# Patient Record
Sex: Male | Born: 1970 | Marital: Married | Smoking: Never smoker
Health system: Southern US, Community
[De-identification: ages and names within clinical notes are randomized; demographics above are authoritative.]

## PROBLEM LIST (undated history)

## (undated) DIAGNOSIS — K219 Gastro-esophageal reflux disease without esophagitis: Secondary | ICD-10-CM

## (undated) DIAGNOSIS — Z87442 Personal history of urinary calculi: Secondary | ICD-10-CM

## (undated) DIAGNOSIS — I10 Essential (primary) hypertension: Secondary | ICD-10-CM

## (undated) DIAGNOSIS — E785 Hyperlipidemia, unspecified: Secondary | ICD-10-CM

## (undated) DIAGNOSIS — N200 Calculus of kidney: Secondary | ICD-10-CM

## (undated) HISTORY — PX: WISDOM TOOTH EXTRACTION: SHX21

## (undated) HISTORY — DX: Gastro-esophageal reflux disease without esophagitis: K21.9

## (undated) HISTORY — DX: Hyperlipidemia, unspecified: E78.5

## (undated) HISTORY — PX: CYST REMOVAL PEDIATRIC: SHX6282

## (undated) HISTORY — PX: OTHER SURGICAL HISTORY: SHX169

## (undated) HISTORY — DX: Calculus of kidney: N20.0

---

## 2008-10-08 DIAGNOSIS — D239 Other benign neoplasm of skin, unspecified: Secondary | ICD-10-CM

## 2008-10-08 HISTORY — DX: Other benign neoplasm of skin, unspecified: D23.9

## 2008-12-04 ENCOUNTER — Ambulatory Visit: Payer: Self-pay | Admitting: General Practice

## 2008-12-18 ENCOUNTER — Ambulatory Visit: Payer: Self-pay | Admitting: General Practice

## 2009-05-03 ENCOUNTER — Ambulatory Visit: Payer: Self-pay | Admitting: Family

## 2009-11-17 ENCOUNTER — Inpatient Hospital Stay: Payer: Self-pay | Admitting: Internal Medicine

## 2011-12-19 DIAGNOSIS — N200 Calculus of kidney: Secondary | ICD-10-CM

## 2011-12-19 HISTORY — DX: Calculus of kidney: N20.0

## 2012-01-25 ENCOUNTER — Emergency Department: Payer: Self-pay | Admitting: Emergency Medicine

## 2012-01-25 LAB — CBC
HCT: 44.8 % (ref 40.0–52.0)
MCHC: 34.3 g/dL (ref 32.0–36.0)
MCV: 86 fL (ref 80–100)
RDW: 14 % (ref 11.5–14.5)
WBC: 10.2 10*3/uL (ref 3.8–10.6)

## 2012-01-25 LAB — URINALYSIS, COMPLETE
Bilirubin,UR: NEGATIVE
Blood: NEGATIVE
Ketone: NEGATIVE
Leukocyte Esterase: NEGATIVE
Nitrite: NEGATIVE
Ph: 7 (ref 4.5–8.0)
Protein: NEGATIVE
RBC,UR: 4 /HPF (ref 0–5)
Specific Gravity: 1.011 (ref 1.003–1.030)
Squamous Epithelial: NONE SEEN
WBC UR: 1 /HPF (ref 0–5)

## 2012-01-25 LAB — BASIC METABOLIC PANEL
Calcium, Total: 9 mg/dL (ref 8.5–10.1)
Chloride: 109 mmol/L — ABNORMAL HIGH (ref 98–107)
EGFR (Non-African Amer.): >60
Osmolality: 286 (ref 275–301)
Potassium: 4 mmol/L (ref 3.5–5.1)

## 2012-04-05 ENCOUNTER — Other Ambulatory Visit: Payer: Self-pay | Admitting: Urology

## 2012-05-28 ENCOUNTER — Other Ambulatory Visit: Payer: Self-pay | Admitting: Urology

## 2012-05-28 LAB — BASIC METABOLIC PANEL
Anion Gap: 6 — ABNORMAL LOW (ref 7–16)
Calcium, Total: 8.7 mg/dL (ref 8.5–10.1)
Chloride: 106 mmol/L (ref 98–107)
Creatinine: 0.86 mg/dL (ref 0.60–1.30)
EGFR (African American): >60
Glucose: 103 mg/dL — ABNORMAL HIGH (ref 65–99)
Osmolality: 282 (ref 275–301)
Sodium: 141 mmol/L (ref 136–145)

## 2012-05-28 LAB — URIC ACID: Uric Acid: 4.8 mg/dL (ref 3.5–7.2)

## 2012-12-06 ENCOUNTER — Ambulatory Visit (INDEPENDENT_AMBULATORY_CARE_PROVIDER_SITE_OTHER): Payer: PRIVATE HEALTH INSURANCE | Admitting: Internal Medicine

## 2012-12-06 ENCOUNTER — Ambulatory Visit: Payer: Self-pay | Admitting: Internal Medicine

## 2012-12-06 ENCOUNTER — Encounter: Payer: Self-pay | Admitting: Internal Medicine

## 2012-12-06 VITALS — BP 120/80 | HR 84 | Temp 98.0°F | Resp 16 | Ht 70.5 in | Wt 193.0 lb

## 2012-12-06 DIAGNOSIS — E785 Hyperlipidemia, unspecified: Secondary | ICD-10-CM

## 2012-12-06 DIAGNOSIS — Z0000 Encounter for general adult medical examination without abnormal findings: Secondary | ICD-10-CM

## 2012-12-06 MED ORDER — SIMVASTATIN 20 MG PO TABS: 20.0000 mg | ORAL_TABLET | Freq: Every evening | ORAL | Status: DC

## 2012-12-06 NOTE — Assessment & Plan Note (Signed)
General medical exam performed today was normal. Given patient's family history of heart disease, recommended a Mediterranean style diet and regular physical activity with goal of 40 minutes 3 times per week. We'll continue statin medication. Health maintenance is up-to-date. Will obtain vaccination records from [facility].

## 2012-12-06 NOTE — Assessment & Plan Note (Signed)
Pt with family hx of CAD in father. On statin. Per review of notes today, no lipids performed with labs at urologist office. Will order CMP and lipids with fasting labs. Follow up 6 months and prn.

## 2012-12-06 NOTE — Progress Notes (Signed)
  Subjective:    Patient ID: Chris Hernandez, male    DOB: 1971-06-23, 41 y.o.   MRN: [id number]  HPI 41 year old male with history of hyperlipidemia and family history of heart disease presents for followup. He was last seen in her previous office over one year ago. He reports he has been doing well. He did have an episode of nephrolithiasis earlier this year and was seen by a urologist. He denies any recent symptoms of pain, hematuria, fever, chills. He reports he is generally feeling well. He has been following a wholes based food diet and exercising by playing basketball with his son.  Outpatient Encounter Prescriptions as of 12/06/2012  Medication Sig Dispense Refill  . Potassium Citrate (UROCIT-K 15) 15 MEQ (1620 MG) TBCR Take by mouth 2 (two) times daily.      . simvastatin (ZOCOR) 20 MG tablet Take 1 tablet (20 mg total) by mouth every evening.  90 tablet  3  . [DISCONTINUED] simvastatin (ZOCOR) 20 MG tablet Take 20 mg by mouth every evening.       BP 120/80  Pulse 84  Temp 98 F (36.7 C) (Oral)  Resp 16  Ht 5' 10.5" (1.791 m)  Wt 193 lb (87.544 kg)  BMI 27.30 kg/m2  SpO2 96%  Review of Systems  Constitutional: Negative for fever, chills, activity change, appetite change, fatigue and unexpected weight change.  Eyes: Negative for visual disturbance.  Respiratory: Negative for cough and shortness of breath.   Cardiovascular: Negative for chest pain, palpitations and leg swelling.  Gastrointestinal: Negative for abdominal pain and abdominal distention.  Genitourinary: Negative for dysuria, urgency and difficulty urinating.  Musculoskeletal: Negative for arthralgias and gait problem.  Skin: Negative for color change and rash.  Hematological: Negative for adenopathy.  Psychiatric/Behavioral: Negative for sleep disturbance and dysphoric mood. The patient is not nervous/anxious.        Objective:   Physical Exam  Constitutional: He is oriented to person, place, and time. He  appears well-developed and well-nourished. No distress.  HENT:  Head: Normocephalic and atraumatic.  Right Ear: External ear normal.  Left Ear: External ear normal.  Nose: Nose normal.  Mouth/Throat: Oropharynx is clear and moist. No oropharyngeal exudate.  Eyes: Conjunctivae normal and EOM are normal. Pupils are equal, round, and reactive to light. Right eye exhibits no discharge. Left eye exhibits no discharge. No scleral icterus.  Neck: Normal range of motion. Neck supple. No tracheal deviation present. No thyromegaly present.  Cardiovascular: Normal rate, regular rhythm and normal heart sounds.  Exam reveals no gallop and no friction rub.   No murmur heard. Pulmonary/Chest: Effort normal and breath sounds normal. No respiratory distress. He has no wheezes. He has no rales. He exhibits no tenderness.  Abdominal: Soft. Bowel sounds are normal. He exhibits no distension. There is no tenderness.  Musculoskeletal: Normal range of motion. He exhibits no edema.  Lymphadenopathy:    He has no cervical adenopathy.  Neurological: He is alert and oriented to person, place, and time. No cranial nerve deficit. Coordination normal.  Skin: Skin is warm and dry. No rash noted. He is not diaphoretic. No erythema. No pallor.  Psychiatric: He has a normal mood and affect. His behavior is normal. Judgment and thought content normal.          Assessment & Plan:

## 2014-01-07 ENCOUNTER — Other Ambulatory Visit: Payer: Self-pay | Admitting: Internal Medicine

## 2014-02-06 ENCOUNTER — Ambulatory Visit: Payer: PRIVATE HEALTH INSURANCE | Admitting: Internal Medicine

## 2014-02-25 ENCOUNTER — Ambulatory Visit (INDEPENDENT_AMBULATORY_CARE_PROVIDER_SITE_OTHER): Payer: 59 | Admitting: Internal Medicine

## 2014-02-25 ENCOUNTER — Encounter: Payer: Self-pay | Admitting: Internal Medicine

## 2014-02-25 VITALS — BP 118/78 | HR 67 | Temp 98.1°F | Wt 198.0 lb

## 2014-02-25 DIAGNOSIS — E785 Hyperlipidemia, unspecified: Secondary | ICD-10-CM

## 2014-02-25 LAB — LIPID PANEL
CHOLESTEROL: 222 mg/dL — AB (ref 0–200)
HDL: 37.7 mg/dL — ABNORMAL LOW (ref >39.00)
LDL CALC: 134 mg/dL — AB (ref 0–99)
TRIGLYCERIDES: 252 mg/dL — AB (ref 0.0–149.0)
Total CHOL/HDL Ratio: 6
VLDL: 50.4 mg/dL — AB (ref 0.0–40.0)

## 2014-02-25 LAB — COMPREHENSIVE METABOLIC PANEL
ALK PHOS: 51 U/L (ref 39–117)
ALT: 51 U/L (ref 0–53)
AST: 32 U/L (ref 0–37)
Albumin: 4.8 g/dL (ref 3.5–5.2)
BILIRUBIN TOTAL: 0.8 mg/dL (ref 0.3–1.2)
BUN: 14 mg/dL (ref 6–23)
CO2: 27 mEq/L (ref 19–32)
Calcium: 9.5 mg/dL (ref 8.4–10.5)
Chloride: 105 mEq/L (ref 96–112)
Creatinine, Ser: 1 mg/dL (ref 0.4–1.5)
GFR: 84.48 mL/min (ref >60.00)
GLUCOSE: 94 mg/dL (ref 70–99)
Potassium: 4.7 mEq/L (ref 3.5–5.1)
SODIUM: 139 meq/L (ref 135–145)
TOTAL PROTEIN: 7.8 g/dL (ref 6.0–8.3)

## 2014-02-25 LAB — CBC WITH DIFFERENTIAL/PLATELET
BASOS ABS: 0 10*3/uL (ref 0.0–0.1)
Basophils Relative: 0.6 % (ref 0.0–3.0)
EOS ABS: 0.2 10*3/uL (ref 0.0–0.7)
Eosinophils Relative: 2.3 % (ref 0.0–5.0)
HCT: 48 % (ref 39.0–52.0)
Hemoglobin: 16.4 g/dL (ref 13.0–17.0)
LYMPHS PCT: 31.9 % (ref 12.0–46.0)
Lymphs Abs: 2.4 10*3/uL (ref 0.7–4.0)
MCHC: 34.2 g/dL (ref 30.0–36.0)
MCV: 86.9 fl (ref 78.0–100.0)
Monocytes Absolute: 0.7 10*3/uL (ref 0.1–1.0)
Monocytes Relative: 9.6 % (ref 3.0–12.0)
NEUTROS PCT: 55.6 % (ref 43.0–77.0)
Neutro Abs: 4.2 10*3/uL (ref 1.4–7.7)
Platelets: 193 10*3/uL (ref 150.0–400.0)
RBC: 5.53 Mil/uL (ref 4.22–5.81)
RDW: 13.8 % (ref 11.5–14.6)
WBC: 7.5 10*3/uL (ref 4.5–10.5)

## 2014-02-25 MED ORDER — SIMVASTATIN 20 MG PO TABS: 20.0000 mg | ORAL_TABLET | Freq: Every day | ORAL | Status: DC

## 2014-02-25 NOTE — Progress Notes (Signed)
Pre visit review using our clinic review tool, if applicable. No additional management support is needed unless otherwise documented below in the visit note. 

## 2014-02-25 NOTE — Progress Notes (Signed)
   Subjective:    Patient ID: Chris Hernandez, male    DOB: 17-Sep-1971, 43 y.o.   MRN: [id number]  HPI 43YO male with hyperlipidemia presents for follow up. He has been feeling well. No concerns today. Tries to follow a healthy diet and exercise regularly.  Outpatient Prescriptions Prior to Visit  Medication Sig Dispense Refill  . simvastatin (ZOCOR) 20 MG tablet Take 1 tablet by mouth every evening.  30 tablet  0     Review of Systems  Constitutional: Negative for fever, chills, activity change, appetite change, fatigue and unexpected weight change.  Eyes: Negative for visual disturbance.  Respiratory: Negative for cough and shortness of breath.   Cardiovascular: Negative for chest pain, palpitations and leg swelling.  Gastrointestinal: Negative for abdominal pain and abdominal distention.  Genitourinary: Negative for dysuria, urgency and difficulty urinating.  Musculoskeletal: Negative for arthralgias and gait problem.  Skin: Negative for color change and rash.  Hematological: Negative for adenopathy.  Psychiatric/Behavioral: Negative for sleep disturbance and dysphoric mood. The patient is not nervous/anxious.        Objective:    BP 118/78  Pulse 67  Temp(Src) 98.1 F (36.7 C) (Oral)  Wt 198 lb (89.812 kg)  SpO2 97% Physical Exam  Constitutional: He is oriented to person, place, and time. He appears well-developed and well-nourished. No distress.  HENT:  Head: Normocephalic and atraumatic.  Right Ear: External ear normal.  Left Ear: External ear normal.  Nose: Nose normal.  Mouth/Throat: Oropharynx is clear and moist. No oropharyngeal exudate.  Eyes: Conjunctivae and EOM are normal. Pupils are equal, round, and reactive to light. Right eye exhibits no discharge. Left eye exhibits no discharge. No scleral icterus.  Neck: Normal range of motion. Neck supple. No tracheal deviation present. No thyromegaly present.  Cardiovascular: Normal rate, regular rhythm and  normal heart sounds.  Exam reveals no gallop and no friction rub.   No murmur heard. Pulmonary/Chest: Effort normal and breath sounds normal. No accessory muscle usage. Not tachypneic. No respiratory distress. He has no decreased breath sounds. He has no wheezes. He has no rhonchi. He has no rales. He exhibits no tenderness.  Musculoskeletal: Normal range of motion. He exhibits no edema.  Lymphadenopathy:    He has no cervical adenopathy.  Neurological: He is alert and oriented to person, place, and time. No cranial nerve deficit. Coordination normal.  Skin: Skin is warm and dry. No rash noted. He is not diaphoretic. No erythema. No pallor.  Psychiatric: He has a normal mood and affect. His behavior is normal. Judgment and thought content normal.          Assessment & Plan:   Problem List Items Addressed This Visit   Hyperlipidemia LDL goal < 100 - Primary     Will check lipids and LFTs with labs. Encouraged healthy, Mediterranean style diet and regular exercise. Follow up 6 months and prn.    Relevant Medications      simvastatin (ZOCOR) tablet   Other Relevant Orders      Comprehensive metabolic panel (Completed)      Lipid panel (Completed)      CBC with Differential (Completed)       Return for Physical.

## 2014-02-26 NOTE — Assessment & Plan Note (Signed)
Will check lipids and LFTs with labs. Encouraged healthy, Mediterranean style diet and regular exercise. Follow up 6 months and prn.

## 2014-03-04 ENCOUNTER — Encounter: Payer: Self-pay | Admitting: *Deleted

## 2014-03-04 ENCOUNTER — Telehealth: Payer: Self-pay | Admitting: *Deleted

## 2014-03-04 MED ORDER — ATORVASTATIN CALCIUM 20 MG PO TABS: 20.0000 mg | ORAL_TABLET | Freq: Every day | ORAL | Status: DC

## 2014-03-04 NOTE — Telephone Encounter (Signed)
Prescription for Atorvastatin sent pharmacy per lab results.

## 2014-03-04 NOTE — Telephone Encounter (Signed)
Message copied by Ronaldo Miyamoto on Wed Mar 04, 2014  1:36 PM ------      Message from: Ronette Deter A      Created: Mon Mar 02, 2014 11:54 AM       Needs recheck CMP and lipids in 4 weeks. ------

## 2014-03-18 ENCOUNTER — Encounter: Payer: Self-pay | Admitting: Internal Medicine

## 2014-03-26 ENCOUNTER — Encounter: Payer: 59 | Admitting: Internal Medicine

## 2014-04-22 ENCOUNTER — Encounter: Payer: Self-pay | Admitting: Internal Medicine

## 2014-04-22 ENCOUNTER — Ambulatory Visit (INDEPENDENT_AMBULATORY_CARE_PROVIDER_SITE_OTHER): Payer: 59 | Admitting: Internal Medicine

## 2014-04-22 VITALS — BP 112/78 | HR 70 | Temp 98.5°F | Ht 70.9 in | Wt 199.0 lb

## 2014-04-22 DIAGNOSIS — E785 Hyperlipidemia, unspecified: Secondary | ICD-10-CM

## 2014-04-22 DIAGNOSIS — Z1211 Encounter for screening for malignant neoplasm of colon: Secondary | ICD-10-CM | POA: Insufficient documentation

## 2014-04-22 DIAGNOSIS — Z0000 Encounter for general adult medical examination without abnormal findings: Secondary | ICD-10-CM | POA: Insufficient documentation

## 2014-04-22 LAB — COMPREHENSIVE METABOLIC PANEL
ALBUMIN: 4.3 g/dL (ref 3.5–5.2)
ALT: 24 U/L (ref 0–53)
AST: 22 U/L (ref 0–37)
Alkaline Phosphatase: 46 U/L (ref 39–117)
BUN: 14 mg/dL (ref 6–23)
CALCIUM: 9.4 mg/dL (ref 8.4–10.5)
CHLORIDE: 108 meq/L (ref 96–112)
CO2: 25 mEq/L (ref 19–32)
Creatinine, Ser: 1.1 mg/dL (ref 0.4–1.5)
GFR: 79.04 mL/min (ref >60.00)
Glucose, Bld: 85 mg/dL (ref 70–99)
POTASSIUM: 4.1 meq/L (ref 3.5–5.1)
Sodium: 140 mEq/L (ref 135–145)
Total Bilirubin: 0.6 mg/dL (ref 0.2–1.2)
Total Protein: 7 g/dL (ref 6.0–8.3)

## 2014-04-22 LAB — LIPID PANEL
CHOL/HDL RATIO: 5
Cholesterol: 153 mg/dL (ref 0–200)
HDL: 33.5 mg/dL — AB (ref >39.00)
LDL Cholesterol: 89 mg/dL (ref 0–99)
Triglycerides: 152 mg/dL — ABNORMAL HIGH (ref 0.0–149.0)
VLDL: 30.4 mg/dL (ref 0.0–40.0)

## 2014-04-22 NOTE — Progress Notes (Signed)
   Subjective:    Patient ID: Chris Hernandez, male    DOB: [birth year], 43 y.o.   MRN: [id number]  HPI 43YO male presents for annual exam. Doing well. No concerns today. Following healthy diet. Exercising 6 days per week. Compliant with medication.    Review of Systems  Constitutional: Negative for fever, chills, activity change, appetite change, fatigue and unexpected weight change.  Eyes: Negative for visual disturbance.  Respiratory: Negative for cough and shortness of breath.   Cardiovascular: Negative for chest pain, palpitations and leg swelling.  Gastrointestinal: Negative for abdominal pain and abdominal distention.  Genitourinary: Negative for dysuria, urgency and difficulty urinating.  Musculoskeletal: Negative for arthralgias and gait problem.  Skin: Negative for color change and rash.  Hematological: Negative for adenopathy.  Psychiatric/Behavioral: Negative for sleep disturbance and dysphoric mood. The patient is not nervous/anxious.        Objective:    BP 112/78  Pulse 70  Temp(Src) 98.5 F (36.9 C) (Oral)  Ht 5' 10.9" (1.801 m)  Wt 199 lb (90.266 kg)  BMI 27.83 kg/m2  SpO2 97% Physical Exam  Constitutional: He is oriented to person, place, and time. He appears well-developed and well-nourished. No distress.  HENT:  Head: Normocephalic and atraumatic.  Right Ear: External ear normal.  Left Ear: External ear normal.  Nose: Nose normal.  Mouth/Throat: Oropharynx is clear and moist. No oropharyngeal exudate.  Eyes: Conjunctivae and EOM are normal. Pupils are equal, round, and reactive to light. Right eye exhibits no discharge. Left eye exhibits no discharge. No scleral icterus.  Neck: Normal range of motion. Neck supple. No tracheal deviation present. No thyromegaly present.  Cardiovascular: Normal rate, regular rhythm and normal heart sounds.  Exam reveals no gallop and no friction rub.   No murmur heard. Pulmonary/Chest: Effort normal and breath sounds  normal. No respiratory distress. He has no wheezes. He has no rales. He exhibits no tenderness.  Abdominal: Soft. Bowel sounds are normal. He exhibits no distension and no mass. There is no tenderness. There is no rebound and no guarding.  Musculoskeletal: Normal range of motion. He exhibits no edema.  Lymphadenopathy:    He has no cervical adenopathy.  Neurological: He is alert and oriented to person, place, and time. No cranial nerve deficit. Coordination normal.  Skin: Skin is warm and dry. No rash noted. He is not diaphoretic. No erythema. No pallor.  Psychiatric: He has a normal mood and affect. His behavior is normal. Judgment and thought content normal.          Assessment & Plan:   Problem List Items Addressed This Visit   Hyperlipidemia LDL goal < 100   Relevant Orders      Comprehensive metabolic panel      Lipid panel   Routine general medical examination at a health care facility - Primary     General medical exam normal. Discussed recommendations for Mediterranean style diet, exercise 59min 3x per week. Immunizations are UTD. Will check CMP, lipids with labs today.        Return in about 6 months (around 10/23/2014) for Recheck.

## 2014-04-22 NOTE — Assessment & Plan Note (Signed)
General medical exam normal. Discussed recommendations for Mediterranean style diet, exercise 7min 3x per week. Immunizations are UTD. Will check CMP, lipids with labs today.

## 2014-04-22 NOTE — Progress Notes (Signed)
Pre visit review using our clinic review tool, if applicable. No additional management support is needed unless otherwise documented below in the visit note. 

## 2014-04-23 ENCOUNTER — Encounter: Payer: Self-pay | Admitting: *Deleted

## 2014-07-09 ENCOUNTER — Other Ambulatory Visit: Payer: Self-pay | Admitting: Internal Medicine

## 2014-10-28 ENCOUNTER — Ambulatory Visit (INDEPENDENT_AMBULATORY_CARE_PROVIDER_SITE_OTHER): Payer: 59 | Admitting: Internal Medicine

## 2014-10-28 ENCOUNTER — Encounter: Payer: Self-pay | Admitting: Internal Medicine

## 2014-10-28 ENCOUNTER — Encounter: Payer: Self-pay | Admitting: *Deleted

## 2014-10-28 VITALS — BP 144/94 | HR 77 | Temp 98.2°F | Wt 200.8 lb

## 2014-10-28 DIAGNOSIS — M25561 Pain in right knee: Secondary | ICD-10-CM | POA: Insufficient documentation

## 2014-10-28 DIAGNOSIS — E785 Hyperlipidemia, unspecified: Secondary | ICD-10-CM

## 2014-10-28 LAB — COMPREHENSIVE METABOLIC PANEL
ALT: 48 U/L (ref 0–53)
AST: 33 U/L (ref 0–37)
Albumin: 4 g/dL (ref 3.5–5.2)
Alkaline Phosphatase: 55 U/L (ref 39–117)
BILIRUBIN TOTAL: 0.7 mg/dL (ref 0.2–1.2)
BUN: 16 mg/dL (ref 6–23)
CHLORIDE: 106 meq/L (ref 96–112)
CO2: 27 meq/L (ref 19–32)
Calcium: 9.5 mg/dL (ref 8.4–10.5)
Creatinine, Ser: 1 mg/dL (ref 0.4–1.5)
GFR: 85.19 mL/min (ref >60.00)
Glucose, Bld: 109 mg/dL — ABNORMAL HIGH (ref 70–99)
Potassium: 4.2 mEq/L (ref 3.5–5.1)
SODIUM: 139 meq/L (ref 135–145)
TOTAL PROTEIN: 7.4 g/dL (ref 6.0–8.3)

## 2014-10-28 LAB — LIPID PANEL
Cholesterol: 189 mg/dL (ref 0–200)
HDL: 30.6 mg/dL — ABNORMAL LOW (ref >39.00)
LDL Cholesterol: 121 mg/dL — ABNORMAL HIGH (ref 0–99)
NONHDL: 158.4
Total CHOL/HDL Ratio: 6
Triglycerides: 189 mg/dL — ABNORMAL HIGH (ref 0.0–149.0)
VLDL: 37.8 mg/dL (ref 0.0–40.0)

## 2014-10-28 MED ORDER — ATORVASTATIN CALCIUM 20 MG PO TABS: 20.0000 mg | ORAL_TABLET | Freq: Every day | ORAL | Status: DC

## 2014-10-28 NOTE — Patient Instructions (Signed)
Labs today.  We will set up an evaluation with Dr. Tamala Julian in Sports Medicine.

## 2014-10-28 NOTE — Progress Notes (Signed)
Pre visit review using our clinic review tool, if applicable. No additional management support is needed unless otherwise documented below in the visit note. 

## 2014-10-28 NOTE — Assessment & Plan Note (Signed)
Check lipids with labs today. Continue Atorvastatin.

## 2014-10-28 NOTE — Assessment & Plan Note (Signed)
Recent issues with anterior right knee pain. Limiting exercise. Exam is normal today. Will set up evaluation with Dr. Tamala Julian in Sports Medicine.

## 2014-10-28 NOTE — Progress Notes (Signed)
Subjective:    Patient ID: Chris Hernandez, male    DOB: May 05, 1971, 43 y.o.   MRN: [id number]  HPI 43YO male presents for follow up.  Right knee pain - varicose vein on back of knee bothering him. Vein has been swollen at times. Also had pain across front of knee. No trauma to knee. Knee was painful with playing Cornhole. Not taking anything for this.  Hyperlipidemia - Compliant with meds. No side effects noted.  Review of Systems  Constitutional: Negative for fever, chills, activity change, appetite change, fatigue and unexpected weight change.  Eyes: Negative for visual disturbance.  Respiratory: Negative for cough and shortness of breath.   Cardiovascular: Negative for chest pain, palpitations and leg swelling.  Gastrointestinal: Negative for abdominal pain, diarrhea, constipation and abdominal distention.  Genitourinary: Negative for dysuria, urgency and difficulty urinating.  Musculoskeletal: Positive for myalgias and arthralgias. Negative for gait problem.  Skin: Negative for color change and rash.  Hematological: Negative for adenopathy.  Psychiatric/Behavioral: Negative for sleep disturbance and dysphoric mood. The patient is not nervous/anxious.        Objective:    BP 144/94 mmHg  Pulse 77  Temp(Src) 98.2 F (36.8 C) (Oral)  Wt 200 lb 12 oz (91.06 kg)  SpO2 97% Physical Exam  Constitutional: He is oriented to person, place, and time. He appears well-developed and well-nourished. No distress.  HENT:  Head: Normocephalic and atraumatic.  Right Ear: External ear normal.  Left Ear: External ear normal.  Nose: Nose normal.  Mouth/Throat: Oropharynx is clear and moist. No oropharyngeal exudate.  Eyes: Conjunctivae and EOM are normal. Pupils are equal, round, and reactive to light. Right eye exhibits no discharge. Left eye exhibits no discharge. No scleral icterus.  Neck: Normal range of motion. Neck supple. No tracheal deviation present. No thyromegaly present.    Cardiovascular: Normal rate, regular rhythm and normal heart sounds.  Exam reveals no gallop and no friction rub.   No murmur heard. Pulmonary/Chest: Effort normal and breath sounds normal. No accessory muscle usage. No tachypnea. No respiratory distress. He has no decreased breath sounds. He has no wheezes. He has no rhonchi. He has no rales. He exhibits no tenderness.  Musculoskeletal: Normal range of motion. He exhibits no edema.       Right knee: He exhibits normal range of motion and no swelling. No tenderness found.  Lymphadenopathy:    He has no cervical adenopathy.  Neurological: He is alert and oriented to person, place, and time. No cranial nerve deficit. Coordination normal.  Skin: Skin is warm and dry. No rash noted. He is not diaphoretic. No erythema. No pallor.  Psychiatric: He has a normal mood and affect. His behavior is normal. Judgment and thought content normal.          Assessment & Plan:   Problem List Items Addressed This Visit      Unprioritized   Hyperlipidemia - Primary    Check lipids with labs today. Continue Atorvastatin.    Relevant Medications      atorvastatin (LIPITOR) tablet   Other Relevant Orders      Comprehensive metabolic panel      Lipid panel   Right knee pain    Recent issues with anterior right knee pain. Limiting exercise. Exam is normal today. Will set up evaluation with Dr. Tamala Julian in Sports Medicine.    Relevant Orders      Ambulatory referral to Sports Medicine       Return in  about 6 months (around 04/28/2015) for Physical.

## 2014-11-10 ENCOUNTER — Ambulatory Visit: Payer: 59 | Admitting: Family Medicine

## 2015-02-13 ENCOUNTER — Emergency Department: Payer: Self-pay | Admitting: Internal Medicine

## 2015-05-03 ENCOUNTER — Encounter: Payer: 59 | Admitting: Internal Medicine

## 2015-05-03 DIAGNOSIS — Z0289 Encounter for other administrative examinations: Secondary | ICD-10-CM

## 2016-01-05 ENCOUNTER — Other Ambulatory Visit: Payer: Self-pay | Admitting: Internal Medicine

## 2016-01-06 ENCOUNTER — Encounter: Payer: Self-pay | Admitting: Internal Medicine

## 2016-01-21 ENCOUNTER — Ambulatory Visit (INDEPENDENT_AMBULATORY_CARE_PROVIDER_SITE_OTHER): Payer: 59 | Admitting: Internal Medicine

## 2016-01-21 ENCOUNTER — Encounter: Payer: Self-pay | Admitting: Internal Medicine

## 2016-01-21 VITALS — BP 131/90 | HR 86 | Temp 98.5°F | Ht 70.0 in | Wt 196.0 lb

## 2016-01-21 DIAGNOSIS — Z0000 Encounter for general adult medical examination without abnormal findings: Secondary | ICD-10-CM

## 2016-01-21 LAB — CBC WITH DIFFERENTIAL/PLATELET
BASOS PCT: 0.5 % (ref 0.0–3.0)
Basophils Absolute: 0 10*3/uL (ref 0.0–0.1)
EOS ABS: 0.2 10*3/uL (ref 0.0–0.7)
EOS PCT: 2.7 % (ref 0.0–5.0)
HCT: 47.8 % (ref 39.0–52.0)
HEMOGLOBIN: 16.3 g/dL (ref 13.0–17.0)
Lymphocytes Relative: 36.4 % (ref 12.0–46.0)
Lymphs Abs: 2.5 10*3/uL (ref 0.7–4.0)
MCHC: 34 g/dL (ref 30.0–36.0)
MCV: 85.4 fl (ref 78.0–100.0)
MONO ABS: 0.6 10*3/uL (ref 0.1–1.0)
Monocytes Relative: 8.6 % (ref 3.0–12.0)
NEUTROS ABS: 3.6 10*3/uL (ref 1.4–7.7)
Neutrophils Relative %: 51.8 % (ref 43.0–77.0)
PLATELETS: 188 10*3/uL (ref 150.0–400.0)
RBC: 5.6 Mil/uL (ref 4.22–5.81)
RDW: 13.9 % (ref 11.5–15.5)
WBC: 7 10*3/uL (ref 4.0–10.5)

## 2016-01-21 LAB — LIPID PANEL
CHOL/HDL RATIO: 6
CHOLESTEROL: 176 mg/dL (ref 0–200)
HDL: 31.6 mg/dL — ABNORMAL LOW (ref >39.00)
LDL CALC: 107 mg/dL — AB (ref 0–99)
NonHDL: 144.23
Triglycerides: 187 mg/dL — ABNORMAL HIGH (ref 0.0–149.0)
VLDL: 37.4 mg/dL (ref 0.0–40.0)

## 2016-01-21 LAB — COMPREHENSIVE METABOLIC PANEL
ALBUMIN: 4.6 g/dL (ref 3.5–5.2)
ALT: 19 U/L (ref 0–53)
AST: 18 U/L (ref 0–37)
Alkaline Phosphatase: 52 U/L (ref 39–117)
BUN: 16 mg/dL (ref 6–23)
CHLORIDE: 107 meq/L (ref 96–112)
CO2: 26 meq/L (ref 19–32)
CREATININE: 1 mg/dL (ref 0.40–1.50)
Calcium: 9.8 mg/dL (ref 8.4–10.5)
GFR: 85.69 mL/min (ref >60.00)
Glucose, Bld: 105 mg/dL — ABNORMAL HIGH (ref 70–99)
POTASSIUM: 4.2 meq/L (ref 3.5–5.1)
Sodium: 141 mEq/L (ref 135–145)
Total Bilirubin: 0.8 mg/dL (ref 0.2–1.2)
Total Protein: 7.4 g/dL (ref 6.0–8.3)

## 2016-01-21 LAB — TSH: TSH: 1.82 u[IU]/mL (ref 0.35–4.50)

## 2016-01-21 LAB — VITAMIN D 25 HYDROXY (VIT D DEFICIENCY, FRACTURES): VITD: 18.91 ng/mL — ABNORMAL LOW (ref 30.00–100.00)

## 2016-01-21 MED ORDER — ATORVASTATIN CALCIUM 20 MG PO TABS: 20.0000 mg | ORAL_TABLET | Freq: Every day | ORAL | Status: DC

## 2016-01-21 NOTE — Progress Notes (Signed)
Subjective:    Patient ID: Chris Hernandez, male    DOB: 01/24/1971, 45 y.o.   MRN: [id number]  HPI  45YO male presents for physical exam.  Feeling well. No concerns today. Compliant with medication.  Wt Readings from Last 3 Encounters:  01/21/16 196 lb (88.905 kg)  10/28/14 200 lb 12 oz (91.06 kg)  04/22/14 199 lb (90.266 kg)   BP Readings from Last 3 Encounters:  01/21/16 131/90  10/28/14 144/94  04/22/14 112/78    Past Medical History  Diagnosis Date  . Kidney stones 2013  . Acid reflux   . Hyperlipidemia    Family History  Problem Relation Age of Onset  . Arthritis Mother   . Hypertension Mother   . Heart disease Father 57  . Hypertension Father   . Cancer Maternal Aunt     Breast  . Hypertension Maternal Grandmother   . Arthritis Maternal Grandmother     rheumatoid  . Cancer Maternal Aunt     Breast  . Diabetes Maternal Aunt    Past Surgical History  Procedure Laterality Date  . Jerrye Bushy    . Cyst removal pediatric      Throat   Social History   Social History  . Marital Status: Married    Spouse Name: N/A  . Number of Children: N/A  . Years of Education: N/A   Social History Main Topics  . Smoking status: Never Smoker   . Smokeless tobacco: None  . Alcohol Use: Yes  . Drug Use: No  . Sexual Activity: Not Asked   Other Topics Concern  . None   Social History Narrative   Lives in [place] with wife and 2 kids 8YO and [place].      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  Eyes: Negative for visual disturbance.  Respiratory: Negative for cough and shortness of breath.   Cardiovascular: Negative for chest pain, palpitations and leg swelling.  Gastrointestinal: Negative for nausea, vomiting, abdominal pain, diarrhea, constipation and abdominal distention.  Genitourinary: Negative for dysuria, urgency and difficulty urinating.  Musculoskeletal: Negative for arthralgias and gait problem.  Skin: Negative for color change and rash.  Neurological: Negative for weakness.  Hematological: Negative for adenopathy.  Psychiatric/Behavioral: Negative for suicidal ideas, sleep disturbance and dysphoric mood. The patient is not nervous/anxious.        Objective:    BP 131/90 mmHg  Pulse 86  Temp(Src) 98.5 F (36.9 C) (Oral)  Ht 5\' 10"  (1.778 m)  Wt 196 lb (88.905 kg)  BMI 28.12 kg/m2  SpO2 96% Physical Exam  Constitutional: He is oriented to person, place, and time. He appears well-developed and well-nourished.  No distress.  HENT:  Head: Normocephalic and atraumatic.  Right Ear: External ear normal.  Left Ear: External ear normal.  Nose: Nose normal.  Mouth/Throat: Oropharynx is clear and moist. No oropharyngeal exudate.  Eyes: Conjunctivae and EOM are normal. Pupils are equal, round, and reactive to light. Right eye exhibits no discharge. Left eye exhibits no discharge. No scleral icterus.  Neck: Normal range of motion. Neck supple. No tracheal deviation present. No thyromegaly present.  Cardiovascular: Normal rate, regular rhythm and normal heart sounds.  Exam reveals no gallop and no friction rub.   No murmur heard. Pulmonary/Chest: Effort normal and breath sounds normal. No respiratory distress. He has no wheezes. He has no rales. He exhibits no tenderness.  Abdominal: Soft. Bowel sounds are normal. He exhibits no distension and no mass. There is no tenderness. There is no  rebound and no guarding.  Musculoskeletal: Normal range of motion. He exhibits no edema.  Lymphadenopathy:    He has no cervical adenopathy.  Neurological: He is alert and oriented to person, place, and time. No cranial nerve deficit. Coordination normal.  Skin: Skin is warm and dry. No rash noted. He is not diaphoretic. No erythema. No pallor.  Psychiatric: He has a normal mood and affect. His behavior is normal. Judgment and thought content normal.          Assessment & Plan:   Problem List Items Addressed This Visit      Unprioritized   Routine general medical examination at a health care facility - Primary    General medical exam normal today. Labs as ordered. Immunizations UTD. Encouraged healthy diet and exercise. Follow up in 6 months and prn.      Relevant Orders   CBC with Differential/Platelet   Comprehensive metabolic panel   Lipid panel   TSH   VITAMIN D 25 Hydroxy (Vit-D Deficiency, Fractures)       Return in about 6 months (around 07/20/2016) for Recheck.

## 2016-01-21 NOTE — Patient Instructions (Signed)

## 2016-01-21 NOTE — Progress Notes (Signed)
Pre visit review using our clinic review tool, if applicable. No additional management support is needed unless otherwise documented below in the visit note. 

## 2016-01-21 NOTE — Assessment & Plan Note (Signed)
General medical exam normal today. Labs as ordered. Immunizations UTD. Encouraged healthy diet and exercise. Follow up in 6 months and prn.

## 2016-07-21 ENCOUNTER — Ambulatory Visit: Payer: 59 | Admitting: Internal Medicine

## 2016-07-26 IMAGING — CT CT ABD-PELV W/O CM
1 of 4 series · 5 of 46 positions shown, 10 images · non-contrast
Comparison: Prior abdominal radiographs 04/05/2012

CLINICAL DATA: 43-year-old male with acute onset right-sided flank
pain and known history of kidney stones.

EXAM:
CT ABDOMEN AND PELVIS WITHOUT CONTRAST
TECHNIQUE: Multidetector CT imaging of the abdomen and pelvis was performed
following the standard protocol without IV contrast.

[Series 4: lung windows · axial · 0.78mm/px · z∈[-42,+44]mm · 5 of 27 slices shown, 10 images]
[im 5/27  soft-tissue]
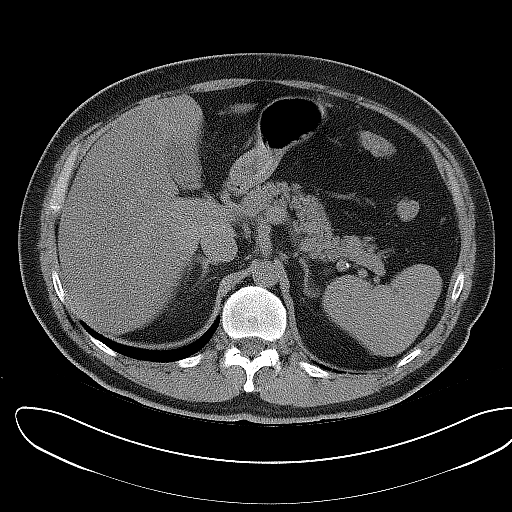
[im 5/27  bone]
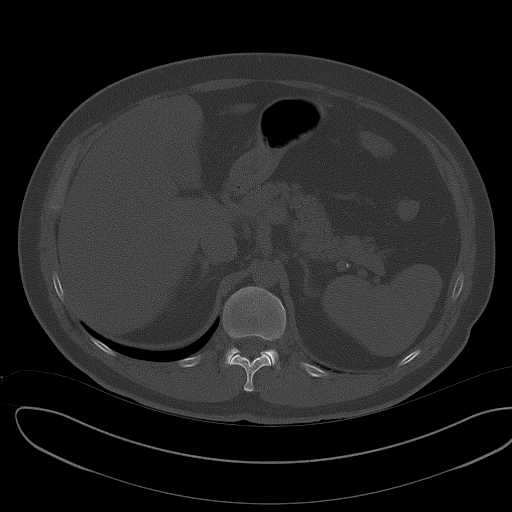
[im 9/27  soft-tissue]
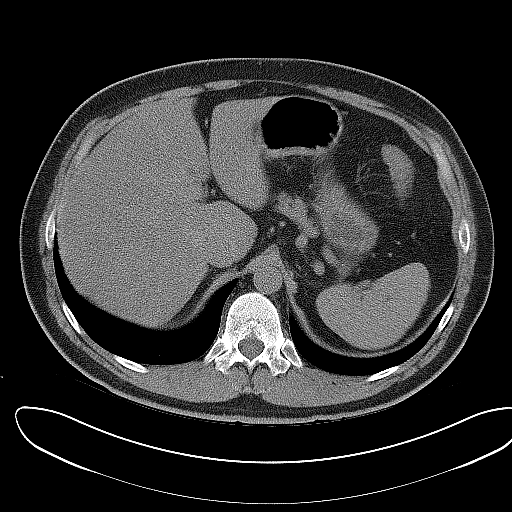
[im 9/27  lung]
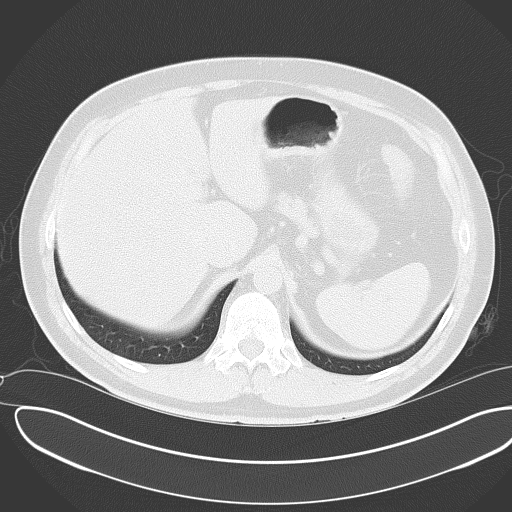
[im 14/27  soft-tissue]
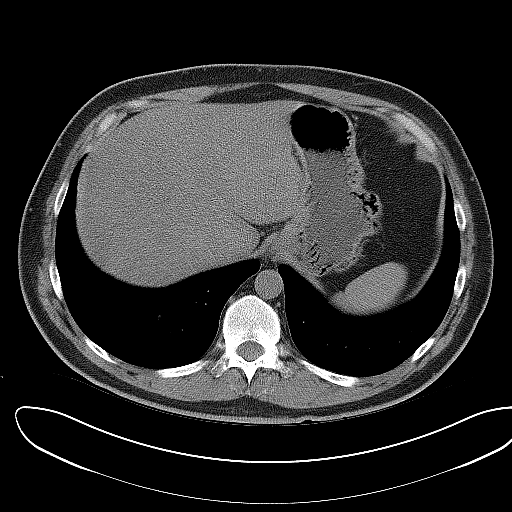
[im 14/27  lung]
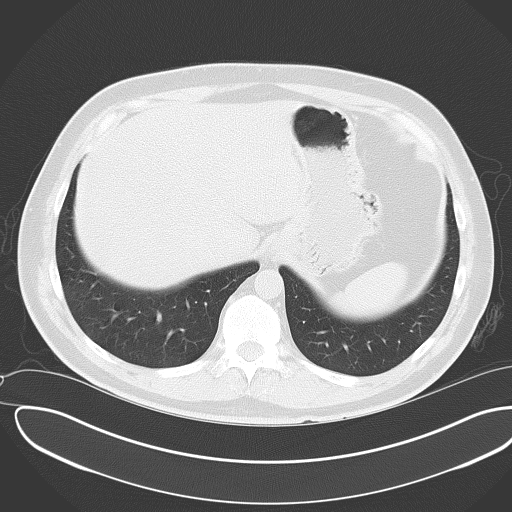
[im 18/27  soft-tissue]
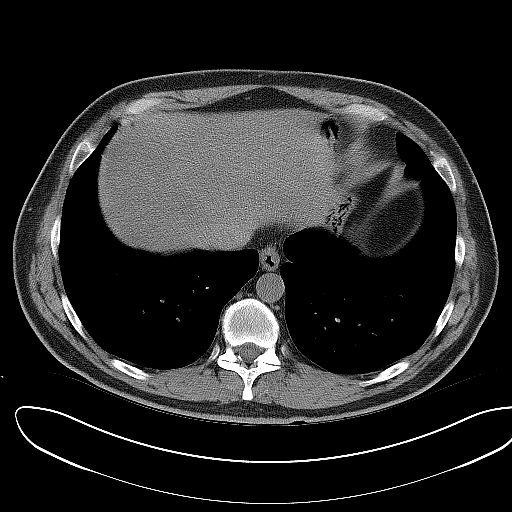
[im 18/27  lung]
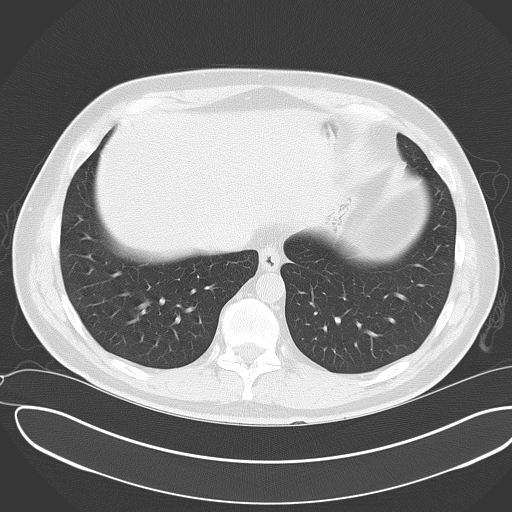
[im 22/27  soft-tissue]
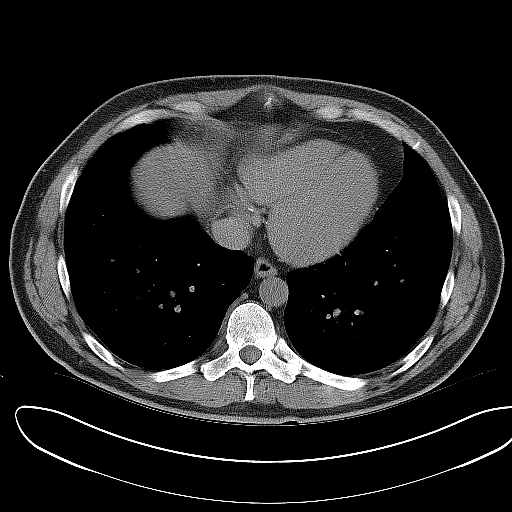
[im 22/27  lung]
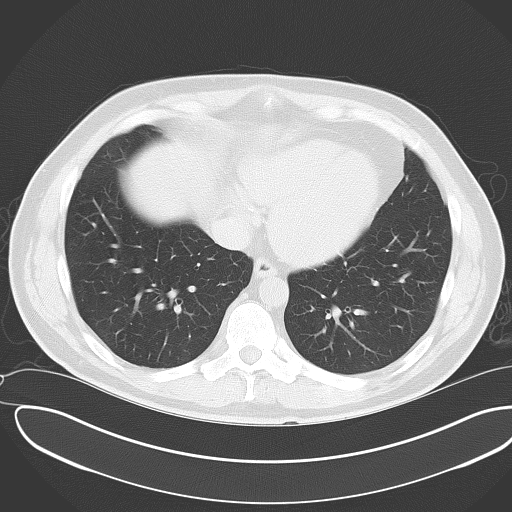

[5 of 46 positions shown; findings below may reference images not displayed]

FINDINGS: Lower Chest: The lung bases are clear. Visualized cardiac structures
are within normal limits for size. No pericardial effusion.
Unremarkable visualized distal thoracic esophagus.

Abdomen: Unenhanced CT was performed per clinician order. Lack of IV
contrast limits sensitivity and specificity, especially for
evaluation of abdominal/pelvic solid viscera. Within these
limitations, unremarkable CT appearance of the stomach, duodenum,
spleen, adrenal glands and pancreas. Normal hepatic contour and
morphology. No discrete hepatic lesion. Low attenuation of the
hepatic parenchyma consistent with steatosis. There is relative
sparing around the gallbladder fossa. Gallbladder is unremarkable.
No intra or extrahepatic biliary ductal dilatation.

All very mild fullness of the right renal collecting system. No
evidence of right-sided nephro or ureterolithiasis. There is mild
dilatation of the right ureter intermittently. No stone noted at the
UVJ or within the bladder. No hydronephrosis on the left. Punctate
stones are present in the interpolar and lower pole collecting
systems. Nonobstructing 3 mm stone present within the UPJ.

No evidence of obstruction or focal bowel wall thickening. Normal
appendix in the right lower quadrant. The terminal ileum is
unremarkable. Solitary descending colonic diverticulum without
evidence of active inflammation. Circumscribed fat attenuation
structures adjacent to the sigmoid colon without surrounding
inflammation likely reflect prominent epiploic appendages. Combined
indirect and direct right inguinal hernia containing herniated
omental fat. No free fluid or suspicious adenopathy.

Pelvis: The bladder is decompressed. Mild prostatomegaly with
central calcification. Central for calcification could be seen on
prior radiography from 5549. No free fluid or suspicious adenopathy.

Bones/Soft Tissues: No acute fracture or aggressive appearing lytic
or blastic osseous lesion. Loss of disc space height at L4-L5 and
L5-S1 consistent with degenerative disc disease.

Vascular: Limited evaluation in the absence of intravenous contrast.
No aneurysmal dilatation. No significant atherosclerotic vascular
calcifications. Incidental note is made of day retro aortic left
renal vein.
IMPRESSION: 1. Although no right-sided renal or ureteral stones are identified,
there is mild prominence of the right renal collecting system and
intermittent mild ureteral dilatation. Given the patient's
right-sided flank pain this may reflect recent passage of a stone.
2. Combined indirect and direct right inguinal hernia containing
omental fat. This could also provide a source for right lower
quadrant pain.
3. Prominent circumscribed fat attenuation structures adjacent to
the sigmoid colon likely represents mildly prominent epiploic
appendages. There is no surrounding inflammatory change to suggest
acute epiploic appendagitis.
4. Punctate nonobstructing nephrolithiasis in the left interpolar
and lower pole collecting systems as well as within the left UPJ.
5. Hepatic steatosis.

## 2017-10-11 ENCOUNTER — Encounter: Payer: Self-pay | Admitting: Family

## 2017-10-11 ENCOUNTER — Ambulatory Visit (INDEPENDENT_AMBULATORY_CARE_PROVIDER_SITE_OTHER): Payer: 59 | Admitting: Family

## 2017-10-11 VITALS — BP 140/100 | HR 73 | Temp 98.0°F | Wt 199.4 lb

## 2017-10-11 DIAGNOSIS — I10 Essential (primary) hypertension: Secondary | ICD-10-CM

## 2017-10-11 MED ORDER — LISINOPRIL 5 MG PO TABS: 5.0000 mg | ORAL_TABLET | Freq: Every day | ORAL | 3 refills | Status: DC

## 2017-10-11 NOTE — Progress Notes (Signed)
Subjective:    Patient ID: Chris Hernandez, male    DOB: [birth year], 46 y.o.   MRN: [id number]  CC: Chris Hernandez is a 46 y.o. male who presents today for follow up.   HPI: Elevated blood pressure at work- no h/o HTN. Starting walking twice per day. Checking  At home- 140/90. Limiting salt.   Denies exertional chest pain or pressure, numbness or tingling radiating to left arm or jaw, palpitations, dizziness, frequent headaches, changes in vision, or shortness of breath.   Non smoker  Reports stress test years which was normal- unable to see records  No h/o ckd        HISTORY:  Past Medical History:  Diagnosis Date  . Acid reflux   . Hyperlipidemia   . Kidney stones 2013   Past Surgical History:  Procedure Laterality Date  . CYST REMOVAL PEDIATRIC     Throat  . gerd     Family History  Problem Relation Age of Onset  . Arthritis Mother   . Hypertension Mother   . Heart disease Father 23  . Hypertension Father   . Cancer Maternal Aunt        Breast  . Hypertension Maternal Grandmother   . Arthritis Maternal Grandmother        rheumatoid  . Cancer Maternal Aunt        Breast  . Diabetes Maternal Aunt     Allergies: Patient has no known allergies. No current outpatient prescriptions on file prior to visit.   No current facility-administered medications on file prior to visit.     Social History  Substance Use Topics  . Smoking status: Never Smoker  . Smokeless tobacco: Not on file  . Alcohol use Yes    Review of Systems  Constitutional: Negative for chills and fever.  Respiratory: Negative for cough.   Cardiovascular: Negative for chest pain and palpitations.  Gastrointestinal: Negative for nausea and vomiting.      Objective:    BP (!) 140/100 (BP Location: Left Arm, Patient Position: Sitting, Cuff Size: Normal)   Pulse 73   Temp 98 F (36.7 C) (Oral)   Wt 199 lb 6 oz (90.4 kg)   SpO2 98%   BMI 28.61 kg/m  BP Readings from  Last 3 Encounters:  10/11/17 (!) 140/100  01/21/16 131/90  10/28/14 (!) 144/94   Wt Readings from Last 3 Encounters:  10/11/17 199 lb 6 oz (90.4 kg)  01/21/16 196 lb (88.9 kg)  10/28/14 200 lb 12 oz (91.1 kg)    Physical Exam  Constitutional: He appears well-developed and well-nourished.  Cardiovascular: Regular rhythm and normal heart sounds.   Pulmonary/Chest: Effort normal and breath sounds normal. No respiratory distress. He has no wheezes. He has no rhonchi. He has no rales.  Neurological: He is alert.  Skin: Skin is warm and dry.  Psychiatric: He has a normal mood and affect. His speech is normal and behavior is normal.  Vitals reviewed.      Assessment & Plan:   Problem List Items Addressed This Visit      Cardiovascular and Mediastinum   HTN (hypertension) - Primary    Elevated today We'll start low-dose ramipril. Pending BMP. Patient will monitor home.      Relevant Medications   lisinopril (PRINIVIL,ZESTRIL) 5 MG tablet   Other Relevant Orders   Basic metabolic panel       I have discontinued Chris Hernandez's atorvastatin. I am also having him start on  lisinopril.   Meds ordered this encounter  Medications  . lisinopril (PRINIVIL,ZESTRIL) 5 MG tablet    Sig: Take 1 tablet (5 mg total) by mouth daily.    Dispense:  90 tablet    Refill:  3    Order Specific Question:   Supervising Provider    Answer:   Chris Hernandez [[id number]]    Return precautions given.   Risks, benefits, and alternatives of the medications and treatment plan prescribed today were discussed, and patient expressed understanding.   Education regarding symptom management and diagnosis given to patient on AVS.  Continue to follow with Chris Hawthorne, FNP for routine health maintenance.   Chris Hernandez and I agreed with plan.   Chris Paris, FNP

## 2017-10-11 NOTE — Patient Instructions (Addendum)
Start low dose lisinopril  Labs on Monday/tuesday  Monitor blood pressure,  Goal is less than 130/80; if persistently higher, please make sooner follow up appointment so we can recheck you blood pressure and manage medications   Managing Your Hypertension Hypertension is commonly called high blood pressure. This is when the force of your blood pressing against the walls of your arteries is too strong. Arteries are blood vessels that carry blood from your heart throughout your body. Hypertension forces the heart to work harder to pump blood, and may cause the arteries to become narrow or stiff. Having untreated or uncontrolled hypertension can cause heart attack, stroke, kidney disease, and other problems. What are blood pressure readings? A blood pressure reading consists of a higher number over a lower number. Ideally, your blood pressure should be below 120/80. The first ("top") number is called the systolic pressure. It is a measure of the pressure in your arteries as your heart beats. The second ("bottom") number is called the diastolic pressure. It is a measure of the pressure in your arteries as the heart relaxes. What does my blood pressure reading mean? Blood pressure is classified into four stages. Based on your blood pressure reading, your health care provider may use the following stages to determine what type of treatment you need, if any. Systolic pressure and diastolic pressure are measured in a unit called mm Hg. Normal  Systolic pressure: below 540.  Diastolic pressure: below 80. Elevated  Systolic pressure: 981-191.  Diastolic pressure: below 80. Hypertension stage 1  Systolic pressure: 478-295.  Diastolic pressure: 62-13. Hypertension stage 2  Systolic pressure: 086 or above.  Diastolic pressure: 90 or above. What health risks are associated with hypertension? Managing your hypertension is an important responsibility. Uncontrolled hypertension can lead to:  A  heart attack.  A stroke.  A weakened blood vessel (aneurysm).  Heart failure.  Kidney damage.  Eye damage.  Metabolic syndrome.  Memory and concentration problems.  What changes can I make to manage my hypertension? Hypertension can be managed by making lifestyle changes and possibly by taking medicines. Your health care provider will help you make a plan to bring your blood pressure within a normal range. Eating and drinking  Eat a diet that is high in fiber and potassium, and low in salt (sodium), added sugar, and fat. An example eating plan is called the DASH (Dietary Approaches to Stop Hypertension) diet. To eat this way: ? Eat plenty of fresh fruits and vegetables. Try to fill half of your plate at each meal with fruits and vegetables. ? Eat whole grains, such as whole wheat pasta, brown rice, or whole grain bread. Fill about one quarter of your plate with whole grains. ? Eat low-fat diary products. ? Avoid fatty cuts of meat, processed or cured meats, and poultry with skin. Fill about one quarter of your plate with lean proteins such as fish, chicken without skin, beans, eggs, and tofu. ? Avoid premade and processed foods. These tend to be higher in sodium, added sugar, and fat.  Reduce your daily sodium intake. Most people with hypertension should eat less than 1,500 mg of sodium a day.  Limit alcohol intake to no more than 1 drink a day for nonpregnant women and 2 drinks a day for men. One drink equals 12 oz of beer, 5 oz of wine, or 1 oz of hard liquor. Lifestyle  Work with your health care provider to maintain a healthy body weight, or to lose weight. Ask  what an ideal weight is for you.  Get at least 30 minutes of exercise that causes your heart to beat faster (aerobic exercise) most days of the week. Activities may include walking, swimming, or biking.  Include exercise to strengthen your muscles (resistance exercise), such as weight lifting, as part of your weekly  exercise routine. Try to do these types of exercises for 30 minutes at least 3 days a week.  Do not use any products that contain nicotine or tobacco, such as cigarettes and e-cigarettes. If you need help quitting, ask your health care provider.  Control any long-term (chronic) conditions you have, such as high cholesterol or diabetes. Monitoring  Monitor your blood pressure at home as told by your health care provider. Your personal target blood pressure may vary depending on your medical conditions, your age, and other factors.  Have your blood pressure checked regularly, as often as told by your health care provider. Working with your health care provider  Review all the medicines you take with your health care provider because there may be side effects or interactions.  Talk with your health care provider about your diet, exercise habits, and other lifestyle factors that may be contributing to hypertension.  Visit your health care provider regularly. Your health care provider can help you create and adjust your plan for managing hypertension. Will I need medicine to control my blood pressure? Your health care provider may prescribe medicine if lifestyle changes are not enough to get your blood pressure under control, and if:  Your systolic blood pressure is 130 or higher.  Your diastolic blood pressure is 80 or higher.  Take medicines only as told by your health care provider. Follow the directions carefully. Blood pressure medicines must be taken as prescribed. The medicine does not work as well when you skip doses. Skipping doses also puts you at risk for problems. Contact a health care provider if:  You think you are having a reaction to medicines you have taken.  You have repeated (recurrent) headaches.  You feel dizzy.  You have swelling in your ankles.  You have trouble with your vision. Get help right away if:  You develop a severe headache or confusion.  You have  unusual weakness or numbness, or you feel faint.  You have severe pain in your chest or abdomen.  You vomit repeatedly.  You have trouble breathing. Summary  Hypertension is when the force of blood pumping through your arteries is too strong. If this condition is not controlled, it may put you at risk for serious complications.  Your personal target blood pressure may vary depending on your medical conditions, your age, and other factors. For most people, a normal blood pressure is less than 120/80.  Hypertension is managed by lifestyle changes, medicines, or both. Lifestyle changes include weight loss, eating a healthy, low-sodium diet, exercising more, and limiting alcohol. This information is not intended to replace advice given to you by your health care provider. Make sure you discuss any questions you have with your health care provider. Document Released: 08/28/2012 Document Revised: 11/01/2016 Document Reviewed: 11/01/2016 Elsevier Interactive Patient Education  [employer].

## 2017-10-11 NOTE — Assessment & Plan Note (Signed)
Elevated today We'll start low-dose ramipril. Pending BMP. Patient will monitor home.

## 2017-10-16 ENCOUNTER — Other Ambulatory Visit (INDEPENDENT_AMBULATORY_CARE_PROVIDER_SITE_OTHER): Payer: 59

## 2017-10-16 DIAGNOSIS — I10 Essential (primary) hypertension: Secondary | ICD-10-CM

## 2017-10-17 LAB — BASIC METABOLIC PANEL
BUN: 13 mg/dL (ref 6–23)
CALCIUM: 9.6 mg/dL (ref 8.4–10.5)
CHLORIDE: 104 meq/L (ref 96–112)
CO2: 29 meq/L (ref 19–32)
Creatinine, Ser: 0.94 mg/dL (ref 0.40–1.50)
GFR: 91.34 mL/min (ref >60.00)
GLUCOSE: 87 mg/dL (ref 70–99)
Potassium: 4.6 mEq/L (ref 3.5–5.1)
Sodium: 141 mEq/L (ref 135–145)

## 2018-04-29 ENCOUNTER — Ambulatory Visit (INDEPENDENT_AMBULATORY_CARE_PROVIDER_SITE_OTHER): Payer: No Typology Code available for payment source | Admitting: Family

## 2018-04-29 ENCOUNTER — Encounter: Payer: Self-pay | Admitting: Family

## 2018-04-29 VITALS — BP 160/98 | HR 63 | Temp 98.9°F | Resp 16 | Wt 198.4 lb

## 2018-04-29 DIAGNOSIS — I10 Essential (primary) hypertension: Secondary | ICD-10-CM

## 2018-04-29 DIAGNOSIS — R1031 Right lower quadrant pain: Secondary | ICD-10-CM | POA: Diagnosis not present

## 2018-04-29 DIAGNOSIS — Z0001 Encounter for general adult medical examination with abnormal findings: Secondary | ICD-10-CM

## 2018-04-29 DIAGNOSIS — K625 Hemorrhage of anus and rectum: Secondary | ICD-10-CM

## 2018-04-29 DIAGNOSIS — Z0000 Encounter for general adult medical examination without abnormal findings: Secondary | ICD-10-CM

## 2018-04-29 NOTE — Progress Notes (Signed)
Subjective:    Patient ID: Chris Hernandez, male    DOB: 1971/09/16, 47 y.o.   MRN: [id number]  CC: Chris Hernandez is a 47 y.o. male who presents today for physical exam.    HPI: Right inguinal hernia ; this was evaluated 4-5 years ago. When protrudes, has pain, which is self limiting. Told to come back if became more bothersome. Would like a surgery consult and feels like when bulges out, it is larger. . No constipation, abdominal pain, N, V.   Occasionally notes BRB on toilet. No straining. No blood in stool. No melena.   HTN- hasnt taken blood pressure medication today, usually takes lisinopril at night. Had wendy's prior to visit. At home 120/ 90. Denies exertional chest pain or pressure, numbness or tingling radiating to left arm or jaw, palpitations, dizziness, frequent headaches, changes in vision, or shortness of breath.       Colorectal  Cancer Screening: no early family history Prostate Cancer Screening: NO urinary hesitancy.  Lung Cancer Screening: No 30 year pack year history and > 55 years. Immunizations       Tetanus - utd         HIV Screening- Candidate for , consents Labs: Screening labs today. Exercise: Gets regular exercise.  Alcohol use: occasoinal Smoking/tobacco use: Nonsmoker.  Regular dental exams:UTD Wears seat belt: Yes. Skin: h/o precancerous lesions removed; hasnt been back  HISTORY:  Past Medical History:  Diagnosis Date  . Acid reflux   . Hyperlipidemia   . Kidney stones 2013    Past Surgical History:  Procedure Laterality Date  . CYST REMOVAL PEDIATRIC     Throat  . gerd     Family History  Problem Relation Age of Onset  . Arthritis Mother   . Hypertension Mother   . Heart disease Father 48  . Hypertension Father   . Cancer Maternal Aunt        Breast  . Hypertension Maternal Grandmother   . Arthritis Maternal Grandmother        rheumatoid  . Cancer Maternal Aunt        Breast  . Diabetes Maternal Aunt        ALLERGIES: Patient has no known allergies.  Current Outpatient Medications on File Prior to Visit  Medication Sig Dispense Refill  . lisinopril (PRINIVIL,ZESTRIL) 5 MG tablet Take 1 tablet (5 mg total) by mouth daily. 90 tablet 3   No current facility-administered medications on file prior to visit.     Social History   Tobacco Use  . Smoking status: Never Smoker  Substance Use Topics  . Alcohol use: Yes    Comment: occasional  . Drug use: No    Review of Systems  Constitutional: Negative for chills and fever.  HENT: Negative for congestion.   Respiratory: Negative for cough.   Cardiovascular: Negative for chest pain, palpitations and leg swelling.  Gastrointestinal: Positive for anal bleeding. Negative for abdominal distention, abdominal pain, blood in stool, diarrhea, nausea and vomiting.  Genitourinary: Negative for difficulty urinating.  Musculoskeletal: Negative for myalgias.  Skin: Negative for rash.  Neurological: Negative for headaches.  Hematological: Negative for adenopathy.  Psychiatric/Behavioral: Negative for confusion.      Objective:    BP (!) 160/98 (BP Location: Left Arm, Patient Position: Sitting, Cuff Size: Normal)   Pulse 63   Temp 98.9 F (37.2 C) (Oral)   Resp 16   Wt 198 lb 6 oz (90 kg)   SpO2 97%  BMI 28.46 kg/m   BP Readings from Last 3 Encounters:  04/29/18 (!) 160/98  10/11/17 (!) 140/100  01/21/16 131/90   Wt Readings from Last 3 Encounters:  04/29/18 198 lb 6 oz (90 kg)  10/11/17 199 lb 6 oz (90.4 kg)  01/21/16 196 lb (88.9 kg)    Physical Exam  Constitutional: He appears well-developed and well-nourished.  Neck: No thyroid mass and no thyromegaly present.  Cardiovascular: Regular rhythm and normal heart sounds.  Pulmonary/Chest: Effort normal and breath sounds normal. No respiratory distress. He has no wheezes. He has no rhonchi. He has no rales.  Genitourinary: Rectal exam shows no external hemorrhoid and no internal  hemorrhoid. Prostate is not enlarged and not tender.     Genitourinary Comments: Prostate exam performed. No asymmetry of lobe, masses, or bogginess appreciated. Nontender.  Area where patient appreciated hernia. No mass, bulge appreciated with palpation or cough today.   No hemorrhoids seen.  Lymphadenopathy:       Head (right side): No submental, no submandibular, no tonsillar, no preauricular, no posterior auricular and no occipital adenopathy present.       Head (left side): No submental, no submandibular, no tonsillar, no preauricular, no posterior auricular and no occipital adenopathy present.    He has no cervical adenopathy.    He has no axillary adenopathy.  Neurological: He is alert.  Skin: Skin is warm and dry.  Psychiatric: He has a normal mood and affect. His speech is normal and behavior is normal.  Vitals reviewed.      Assessment & Plan:   Problem List Items Addressed This Visit      Cardiovascular and Mediastinum   HTN (hypertension)    Elevated today. No CP. Takes lisinopril in evenings. He will keep blood pressure log as we may need to increase lisinopril to 10mg  as patient is aware.         Digestive   Anal bleeding    BRB, Occasional. Benign exam. Pending stool cards.         Other   Routine general medical examination at a health care facility - Primary    No hernia or hemorrhoid appreciated on exam. Prostate exam performed. Patient would like to screen with PSA which has been ordered. Referral to dermatology for annual skin check.       Relevant Orders   Lipid panel   PSA   Comprehensive metabolic panel   HIV antibody   Ambulatory referral to Dermatology   Fecal occult blood, imunochemical   Right groin pain     Benign exam. Unable to appreciate hernia. Pending surgical consult as patient reports occasional pain, larger in size. Will follow      Relevant Orders   Ambulatory referral to General Surgery       I am having Judeth Cornfield.  [place] maintain his lisinopril.   No orders of the defined types were placed in this encounter.   Return precautions given.   Risks, benefits, and alternatives of the medications and treatment plan prescribed today were discussed, and patient expressed understanding.   Education regarding symptom management and diagnosis given to patient on AVS.   Continue to follow with Burnard Hawthorne, FNP for routine health maintenance.   [name] and I agreed with plan.   [name], FNP

## 2018-04-29 NOTE — Patient Instructions (Addendum)
Monitor blood pressure,  Goal is less than 130/80; if persistently higher, please make sooner follow up appointment so we can recheck you blood pressure and manage medications Send me a log via mychart.   Stool cards.   Today we discussed referrals, orders. Dermatology, surgery   I have placed these orders in the system for you.  Please be sure to give [place] a call if you have not heard from our office regarding scheduling a test or regarding referral in a timely manner.  It is very important that you let me know as soon as possible.    Health Maintenance, Male A healthy lifestyle and preventive care is important for your health and wellness. Ask your health care provider about what schedule of regular examinations is right for you. What should I know about weight and diet? Eat a Healthy Diet  Eat plenty of vegetables, fruits, whole grains, low-fat dairy products, and lean protein.  Do not eat a lot of foods high in solid fats, added sugars, or salt.  Maintain a Healthy Weight Regular exercise can help you achieve or maintain a healthy weight. You should:  Do at least 150 minutes of exercise each week. The exercise should increase your heart rate and make you sweat (moderate-intensity exercise).  Do strength-training exercises at least twice a week.  Watch Your Levels of Cholesterol and Blood Lipids  Have your blood tested for lipids and cholesterol every 5 years starting at 48 years of age. If you are at high risk for heart disease, you should start having your blood tested when you are 48 years old. You may need to have your cholesterol levels checked more often if: ? Your lipid or cholesterol levels are high. ? You are older than 48 years of age. ? You are at high risk for heart disease.  What should I know about cancer screening? Many types of cancers can be detected early and may often be prevented. Lung Cancer  You should be screened every year for lung cancer if: ? You are a  current smoker who has smoked for at least 30 years. ? You are a former smoker who has quit within the past 15 years.  Talk to your health care provider about your screening options, when you should start screening, and how often you should be screened.  Colorectal Cancer  Routine colorectal cancer screening usually begins at 48 years of age and should be repeated every 5-10 years until you are 48 years old. You may need to be screened more often if early forms of precancerous polyps or small growths are found. Your health care provider may recommend screening at an earlier age if you have risk factors for colon cancer.  Your health care provider may recommend using home test kits to check for hidden blood in the stool.  A small camera at the end of a tube can be used to examine your colon (sigmoidoscopy or colonoscopy). This checks for the earliest forms of colorectal cancer.  Prostate and Testicular Cancer  Depending on your age and overall health, your health care provider may do certain tests to screen for prostate and testicular cancer.  Talk to your health care provider about any symptoms or concerns you have about testicular or prostate cancer.  Skin Cancer  Check your skin from head to toe regularly.  Tell your health care provider about any new moles or changes in moles, especially if: ? There is a change in a mole's size, shape, or  color. ? You have a mole that is larger than a pencil eraser.  Always use sunscreen. Apply sunscreen liberally and repeat throughout the day.  Protect yourself by wearing long sleeves, pants, a wide-brimmed hat, and sunglasses when outside.  What should I know about heart disease, diabetes, and high blood pressure?  If you are 36-40 years of age, have your blood pressure checked every 3-5 years. If you are 45 years of age or older, have your blood pressure checked every year. You should have your blood pressure measured twice-once when you are at  a hospital or clinic, and once when you are not at a hospital or clinic. Record the average of the two measurements. To check your blood pressure when you are not at a hospital or clinic, you can use: ? An automated blood pressure machine at a pharmacy. ? A home blood pressure monitor.  Talk to your health care provider about your target blood pressure.  If you are between 30-25 years old, ask your health care provider if you should take aspirin to prevent heart disease.  Have regular diabetes screenings by checking your fasting blood sugar level. ? If you are at a normal weight and have a low risk for diabetes, have this test once every three years after the age of 7. ? If you are overweight and have a high risk for diabetes, consider being tested at a younger age or more often.  A one-time screening for abdominal aortic aneurysm (AAA) by ultrasound is recommended for men aged 65-75 years who are current or former smokers. What should I know about preventing infection? Hepatitis B If you have a higher risk for hepatitis B, you should be screened for this virus. Talk with your health care provider to find out if you are at risk for hepatitis B infection. Hepatitis C Blood testing is recommended for:  Everyone born from [birth year] through 1966.  Anyone with known risk factors for hepatitis C.  Sexually Transmitted Diseases (STDs)  You should be screened each year for STDs including gonorrhea and chlamydia if: ? You are sexually active and are younger than 48 years of age. ? You are older than 48 years of age and your health care provider tells you that you are at risk for this type of infection. ? Your sexual activity has changed since you were last screened and you are at an increased risk for chlamydia or gonorrhea. Ask your health care provider if you are at risk.  Talk with your health care provider about whether you are at high risk of being infected with HIV. Your health care provider  may recommend a prescription medicine to help prevent HIV infection.  What else can I do?  Schedule regular health, dental, and eye exams.  Stay current with your vaccines (immunizations).  Do not use any tobacco products, such as cigarettes, chewing tobacco, and e-cigarettes. If you need help quitting, ask your health care provider.  Limit alcohol intake to no more than 2 drinks per day. One drink equals 12 ounces of beer, 5 ounces of wine, or 1 ounces of hard liquor.  Do not use street drugs.  Do not share needles.  Ask your health care provider for help if you need support or information about quitting drugs.  Tell your health care provider if you often feel depressed.  Tell your health care provider if you have ever been abused or do not feel safe at home. This information is not intended to replace  advice given to you by your health care provider. Make sure you discuss any questions you have with your health care provider. Document Released: 06/01/2008 Document Revised: 08/02/2016 Document Reviewed: 09/07/2015 Elsevier Interactive Patient Education  [employer].

## 2018-04-29 NOTE — Assessment & Plan Note (Signed)
Elevated today. No CP. Takes lisinopril in evenings. He will keep blood pressure log as we may need to increase lisinopril to 10mg  as patient is aware.

## 2018-04-29 NOTE — Assessment & Plan Note (Signed)
Benign exam. Unable to appreciate hernia. Pending surgical consult as patient reports occasional pain, larger in size. Will follow

## 2018-04-29 NOTE — Assessment & Plan Note (Signed)
No hernia or hemorrhoid appreciated on exam. Prostate exam performed. Patient would like to screen with PSA which has been ordered. Referral to dermatology for annual skin check.

## 2018-04-29 NOTE — Assessment & Plan Note (Signed)
BRB, Occasional. Benign exam. Pending stool cards.

## 2018-05-01 ENCOUNTER — Encounter: Payer: Self-pay | Admitting: *Deleted

## 2018-05-01 NOTE — Progress Notes (Signed)
Spoke to patient states last couple of blood pressure readings have been 119/89 and 121/90. Will keep log for 1 week and send via mychart

## 2018-05-03 ENCOUNTER — Other Ambulatory Visit (INDEPENDENT_AMBULATORY_CARE_PROVIDER_SITE_OTHER): Payer: No Typology Code available for payment source

## 2018-05-03 DIAGNOSIS — Z0000 Encounter for general adult medical examination without abnormal findings: Secondary | ICD-10-CM

## 2018-05-03 NOTE — Addendum Note (Signed)
Addended by: Arby Barrette on: 05/03/2018 11:09 AM   Modules accepted: Orders

## 2018-05-04 LAB — COMPREHENSIVE METABOLIC PANEL
AG Ratio: 1.8 (calc) (ref 1.0–2.5)
ALKALINE PHOSPHATASE (APISO): 48 U/L (ref 40–115)
ALT: 19 U/L (ref 9–46)
AST: 17 U/L (ref 10–40)
Albumin: 4.7 g/dL (ref 3.6–5.1)
BILIRUBIN TOTAL: 0.4 mg/dL (ref 0.2–1.2)
BUN: 14 mg/dL (ref 7–25)
CALCIUM: 9.6 mg/dL (ref 8.6–10.3)
CO2: 24 mmol/L (ref 20–32)
Chloride: 107 mmol/L (ref 98–110)
Creat: 1.09 mg/dL (ref 0.60–1.35)
Globulin: 2.6 g/dL (calc) (ref 1.9–3.7)
Glucose, Bld: 101 mg/dL — ABNORMAL HIGH (ref 65–99)
POTASSIUM: 4.8 mmol/L (ref 3.5–5.3)
Sodium: 140 mmol/L (ref 135–146)
Total Protein: 7.3 g/dL (ref 6.1–8.1)

## 2018-05-04 LAB — LIPID PANEL
CHOLESTEROL: 242 mg/dL — AB (ref <200)
HDL: 31 mg/dL — ABNORMAL LOW (ref >40)
LDL Cholesterol (Calc): 172 mg/dL (calc) — ABNORMAL HIGH
Non-HDL Cholesterol (Calc): 211 mg/dL (calc) — ABNORMAL HIGH (ref <130)
TRIGLYCERIDES: 219 mg/dL — AB (ref <150)
Total CHOL/HDL Ratio: 7.8 (calc) — ABNORMAL HIGH (ref <5.0)

## 2018-05-04 LAB — PSA: PSA: 0.4 ng/mL (ref <=4.0)

## 2018-05-05 LAB — HIV ANTIBODY (ROUTINE TESTING W REFLEX): HIV: NONREACTIVE

## 2018-05-10 ENCOUNTER — Encounter: Payer: Self-pay | Admitting: Family

## 2018-05-10 ENCOUNTER — Other Ambulatory Visit: Payer: Self-pay | Admitting: Family

## 2018-05-10 DIAGNOSIS — E785 Hyperlipidemia, unspecified: Secondary | ICD-10-CM

## 2018-05-10 MED ORDER — ROSUVASTATIN CALCIUM 10 MG PO TABS: 10.0000 mg | ORAL_TABLET | Freq: Every day | ORAL | 3 refills | Status: DC

## 2018-05-30 ENCOUNTER — Encounter: Payer: Self-pay | Admitting: General Surgery

## 2018-05-30 ENCOUNTER — Ambulatory Visit (INDEPENDENT_AMBULATORY_CARE_PROVIDER_SITE_OTHER): Payer: No Typology Code available for payment source | Admitting: General Surgery

## 2018-05-30 VITALS — BP 132/78 | HR 76 | Resp 11 | Ht 71.5 in | Wt 199.0 lb

## 2018-05-30 DIAGNOSIS — K4090 Unilateral inguinal hernia, without obstruction or gangrene, not specified as recurrent: Secondary | ICD-10-CM

## 2018-05-30 DIAGNOSIS — Z8719 Personal history of other diseases of the digestive system: Secondary | ICD-10-CM

## 2018-05-30 DIAGNOSIS — K648 Other hemorrhoids: Secondary | ICD-10-CM

## 2018-05-30 NOTE — Patient Instructions (Addendum)
The patient is aware to call back for any questions or concerns. Internal Hemorrhoid banding  Hemorrhoids Hemorrhoids are swollen veins in and around the rectum or anus. Hemorrhoids can cause pain, itching, or bleeding. Most of the time, they do not cause serious problems. They usually get better with diet changes, lifestyle changes, and other home treatments. Follow these instructions at home: Eating and drinking  Eat foods that have fiber, such as whole grains, beans, nuts, fruits, and vegetables. Ask your doctor about taking products that have added fiber (fibersupplements).  Drink enough fluid to keep your pee (urine) clear or pale yellow. For Pain and Swelling  Take a warm-water bath (sitz bath) for 20 minutes to ease pain. Do this 3-4 times a day.  If directed, put ice on the painful area. It may be helpful to use ice between your warm baths. ? Put ice in a plastic bag. ? Place a towel between your skin and the bag. ? Leave the ice on for 20 minutes, 2-3 times a day. General instructions  Take over-the-counter and prescription medicines only as told by your doctor. ? Medicated creams and medicines that are inserted into the anus (suppositories) may be used or applied as told.  Exercise often.  Go to the bathroom when you have the urge to poop (to have a bowel movement). Do not wait.  Avoid pushing too hard (straining) when you poop.  Keep the butt area dry and clean. Use wet toilet paper or moist paper towels.  Do not sit on the toilet for a long time. Contact a doctor if:  You have any of these: ? Pain and swelling that do not get better with treatment or medicine. ? Bleeding that will not stop. ? Trouble pooping or you cannot poop. ? Pain or swelling outside the area of the hemorrhoids. This information is not intended to replace advice given to you by your health care provider. Make sure you discuss any questions you have with your health care provider. Document  Released: 09/12/2008 Document Revised: 05/11/2016 Document Reviewed: 08/18/2015 Elsevier Interactive Patient Education  2018 [place].  Inguinal Hernia, Adult An inguinal hernia is when fat or the intestines push through the area where the leg meets the lower belly (groin) and make a rounded lump (bulge). This condition happens over time. There are three types of inguinal hernias. These types include:  Hernias that can be pushed back into the belly (are reducible).  Hernias that cannot be pushed back into the belly (are incarcerated).  Hernias that cannot be pushed back into the belly and lose their blood supply (get strangulated). This type needs emergency surgery.  Follow these instructions at home: Lifestyle  Drink enough fluid to keep your urine (pee) clear or pale yellow.  Eat plenty of fruits, vegetables, and whole grains. These have a lot of fiber. Talk with your doctor if you have questions.  Avoid lifting heavy objects.  Avoid standing for long periods of time.  Do not use tobacco products. These include cigarettes, chewing tobacco, or e-cigarettes. If you need help quitting, ask your doctor.  Try to stay at a healthy weight. General instructions  Do not try to force the hernia back in.  Watch your hernia for any changes in color or size. Let your doctor know if there are any changes.  Take over-the-counter and prescription medicines only as told by your doctor.  Keep all follow-up visits as told by your doctor. This is important. Contact a doctor if:  You have a fever.  You have new symptoms.  Your symptoms get worse. Get help right away if:  The area where the legs meets the lower belly has: ? Pain that gets worse suddenly. ? A bulge that gets bigger suddenly and does not go down. ? A bulge that turns red or purple. ? A bulge that is painful to the touch.  You are a man and your scrotum: ? Suddenly feels painful. ? Suddenly changes in size.  You  feel sick to your stomach (nauseous) and this feeling does not go away.  You throw up (vomit) and this keeps happening.  You feel your heart beating a lot more quickly than normal.  You cannot poop (have a bowel movement) or pass gas. This information is not intended to replace advice given to you by your health care provider. Make sure you discuss any questions you have with your health care provider. Document Released: 01/04/2007 Document Revised: 05/11/2016 Document Reviewed: 10/14/2014 Elsevier Interactive Patient Education  [employer].  The patient is scheduled for surgery on 07/05/18. He will pre admit by phone. The patient is aware of date and instructions.

## 2018-05-30 NOTE — Progress Notes (Signed)
Patient ID: Chris Hernandez, male   DOB: 04/21/1971, 47 y.o.   MRN: [id number]  Chief Complaint  Patient presents with  . Other    right groin pain    HPI Chris Hernandez is a 47 y.o. male.  Here for evaluation of right groin pain with a knot referred by [name] FNP. He states he noticed this 3 years ago prior CT scan. He states that about 2-3 months ago he started having some pain that comes and goes based on activity at the gym. He states the discomfort is more often here lately.   He thinks he has a hemorrhoid as well. Bowels move daily with occasional bleeding over the past few months. 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  HPI  Past Medical History:  Diagnosis Date  . Acid reflux   . Hyperlipidemia   . Kidney stones 2013   kidney stones    Past Surgical History:  Procedure Laterality Date  . CYST REMOVAL PEDIATRIC     Throat  . gerd      Family History  Problem Relation Age of Onset  . Arthritis Mother   . Hypertension Mother   . Colon polyps Mother 26  . Heart disease Father 21  . Hypertension Father   . Cancer Maternal Aunt        Breast  . Hypertension Maternal Grandmother   . Arthritis Maternal Grandmother        rheumatoid  . Cancer Maternal Aunt        Breast  . Diabetes Maternal Aunt     Social History Social History   Tobacco Use  . Smoking status: Never Smoker  . Smokeless tobacco: Never Used  Substance Use Topics  . Alcohol use: Yes    Comment: occasional  . Drug use: No    No Known Allergies  Current Outpatient Medications  Medication Sig Dispense Refill  . lisinopril (PRINIVIL,ZESTRIL) 5 MG tablet Take 1 tablet (5 mg total) by mouth daily. 90 tablet 3  . rosuvastatin (CRESTOR) 10 MG tablet Take 1 tablet (10 mg total) by mouth daily. 90 tablet 3   No current facility-administered medications for this visit.     Review of Systems Review of  Systems  Constitutional: Negative.   Respiratory: Negative.   Cardiovascular: Negative.   Gastrointestinal: Negative for constipation, diarrhea and nausea.    Blood pressure 132/78, pulse 76, resp. rate 11, height 5' 11.5" (1.816 m), weight 199 lb (90.3 kg), SpO2 98 %.  Physical Exam Physical Exam  Constitutional: He is oriented to person, place, and time. He appears well-developed and well-nourished.  HENT:  Mouth/Throat: Oropharynx is clear and moist.  Eyes: Conjunctivae are normal. No scleral icterus.  Neck: Neck supple.  Cardiovascular: Normal rate, regular rhythm and normal heart sounds.  Pulmonary/Chest: Effort normal and breath sounds normal.  Abdominal: Soft. A hernia is present. Hernia confirmed positive in the right inguinal area.  Genitourinary: Prostate normal and testes normal. Rectal exam shows internal hemorrhoid. Rectal exam shows no fissure, anal tone normal and guaiac negative stool.     Genitourinary Comments: Left anterior internal hemohhoid  Lymphadenopathy:    He has no cervical adenopathy.  Neurological: He is alert and oriented to person, place, and time.  Skin: Skin is warm and dry.  Psychiatric: His behavior is normal.    Data Reviewed  May 03, 2018 comprehensive metabolic panel notable  for blood sugar 101 (nonfasting).  Normal electrolytes.  Normal liver function studies.  January 21, 2016 CBC: Hemoglobin 16.3 with an MCV of 85, white blood cell count 7000, platelet count of 188,000.   February 13, 2015 CT: IMPRESSION:  1. Although no right-sided renal or ureteral stones are identified,  there is mild prominence of the right renal collecting system and  intermittent mild ureteral dilatation. Given the patient's  right-sided flank pain this may reflect recent passage of a stone.  2. Combined indirect and direct right inguinal hernia containing  omental fat. This could also provide a source for right lower  quadrant pain.  3. Prominent  circumscribed fat attenuation structures adjacent to  the sigmoid colon likely represents mildly prominent epiploic  appendages. There is no surrounding inflammatory change to suggest  acute epiploic appendagitis.  4. Punctate nonobstructing nephrolithiasis in the left interpolar  and lower pole collecting systems as well as within the left UPJ.  5. Hepatic steatosis.  These films were not available for review at the time of dictation.  Assessment    Sizable, reducible right inguinal hernia.  Internal hemorrhoids with intermittent rectal bleeding.,  Unlikely higher source.    Plan    Internal hemorrhoid banding and hernia precautions and incarceration were discussed with the patient. If they develop symptoms of an incarcerated hernia, they were encouraged to seek prompt medical attention.  We discussed the pros and cons of colonoscopy versus hemorrhoid banding to determine if this would resolve his intermittent rectal bleeding.  There is no family history or any other activity that puts him at high risk for a higher colonic lesion, and we have elected to band the hemorrhoids and see if this does not resolve the issue.  If not, colonoscopy would be indicated.  I have recommended repair of the hernia using mesh on an outpatient basis in the near future. The risk of infection was reviewed. The role of prosthetic mesh to minimize the risk of recurrence was reviewed.      HPI, Physical Exam, Assessment and Plan have been scribed under the direction and in the presence of Robert Bellow, MD. Karie Fetch, RN  I have completed the exam and reviewed the above documentation for accuracy and completeness.  I agree with the above.  Haematologist has been used and any errors in dictation or transcription are unintentional.  Hervey Ard, M.D., F.A.C.S.  The patient is scheduled for surgery on 07/05/18. He will pre admit by phone. The patient is aware of date and instructions. The  patient may call back to r/s his surgery date after he speaks with his spouse. Documented by Caryl-Lyn Otis Brace LPN  Chris Hernandez 06/01/2018, 11:16 AM

## 2018-06-03 ENCOUNTER — Telehealth: Payer: Self-pay

## 2018-06-03 LAB — POC HEMOCCULT BLD/STL (OFFICE/1-CARD/DIAGNOSTIC): FECAL OCCULT BLD: NEGATIVE

## 2018-06-03 NOTE — Telephone Encounter (Signed)
The patient called to reschedule his surgery. He is now scheduled for surgery at [facility] on 06/28/18. He will pre admit by phone. The patient is aware of date and instructions.

## 2018-06-04 ENCOUNTER — Other Ambulatory Visit: Payer: Self-pay | Admitting: General Surgery

## 2018-06-04 DIAGNOSIS — K648 Other hemorrhoids: Secondary | ICD-10-CM

## 2018-06-07 ENCOUNTER — Encounter: Payer: Self-pay | Admitting: Family

## 2018-06-14 ENCOUNTER — Encounter: Payer: Self-pay | Admitting: General Surgery

## 2018-06-21 ENCOUNTER — Inpatient Hospital Stay: Admission: RE | Admit: 2018-06-21 | Payer: No Typology Code available for payment source | Source: Ambulatory Visit

## 2018-06-28 ENCOUNTER — Encounter: Admission: RE | Payer: Self-pay | Source: Ambulatory Visit

## 2018-06-28 ENCOUNTER — Ambulatory Visit
Admission: RE | Admit: 2018-06-28 | Payer: No Typology Code available for payment source | Source: Ambulatory Visit | Admitting: General Surgery

## 2018-06-28 SURGERY — REPAIR, HERNIA, INGUINAL, ADULT
Anesthesia: Choice | Laterality: Right

## 2018-07-22 ENCOUNTER — Telehealth: Payer: Self-pay

## 2018-07-22 NOTE — Telephone Encounter (Signed)
Left message informing the patient that the September and October surgery schedule is open and he may call back to reschedule his surgery.

## 2018-07-31 ENCOUNTER — Telehealth: Payer: Self-pay

## 2018-07-31 NOTE — Telephone Encounter (Signed)
Another message left for patient to call about rescheduling his surgery.

## 2018-08-01 ENCOUNTER — Telehealth: Payer: Self-pay

## 2018-08-01 NOTE — Telephone Encounter (Signed)
Patient came by the office to see about rescheduling his surgery. He states that he feels like he may also have hernia in the left groin and would like this assessed prior to surgery. He will come in for a pre op visit and exam here in the office with Dr Bary Castilla on 08/15/18 at 4:15 pm. He is scheduled for surgery at [facility] on 09/02/18. He will pre admit by phone. The patient is aware of dates, time, and instructions.

## 2018-08-15 ENCOUNTER — Ambulatory Visit (INDEPENDENT_AMBULATORY_CARE_PROVIDER_SITE_OTHER): Payer: No Typology Code available for payment source | Admitting: General Surgery

## 2018-08-15 ENCOUNTER — Encounter: Payer: Self-pay | Admitting: General Surgery

## 2018-08-15 VITALS — BP 128/78 | HR 70 | Resp 12 | Ht 71.5 in | Wt 174.0 lb

## 2018-08-15 DIAGNOSIS — K648 Other hemorrhoids: Secondary | ICD-10-CM

## 2018-08-15 DIAGNOSIS — D171 Benign lipomatous neoplasm of skin and subcutaneous tissue of trunk: Secondary | ICD-10-CM | POA: Diagnosis not present

## 2018-08-15 DIAGNOSIS — K4090 Unilateral inguinal hernia, without obstruction or gangrene, not specified as recurrent: Secondary | ICD-10-CM | POA: Diagnosis not present

## 2018-08-15 NOTE — Progress Notes (Signed)
Patient ID: Chris Hernandez, male   DOB: November 21, 1971, 47 y.o.   MRN: [id number]  Chief Complaint  Patient presents with  . Pre-op Exam    HPI Chris Hernandez is a 47 y.o. male.  Here for preop right inguinal hernia repair and hemorrhoid banding on 09-02-18. He states he has noticed a knot in the left lower abdomen over the last month.  This was during the same time when he  lost 25 pounds on Keto diet since last visit in June 2019.[name]  HPI  Past Medical History:  Diagnosis Date  . Acid reflux   . Hyperlipidemia   . Kidney stones 2013   kidney stones    Past Surgical History:  Procedure Laterality Date  . CYST REMOVAL PEDIATRIC     Throat  . gerd      Family History  Problem Relation Age of Onset  . Arthritis Mother   . Hypertension Mother   . Colon polyps Mother 55  . Heart disease Father 32  . Hypertension Father   . Cancer Maternal Aunt        Breast  . Hypertension Maternal Grandmother   . Arthritis Maternal Grandmother        rheumatoid  . Cancer Maternal Aunt        Breast  . Diabetes Maternal Aunt     Social History Social History   Tobacco Use  . Smoking status: Never Smoker  . Smokeless tobacco: Never Used  Substance Use Topics  . Alcohol use: Yes    Comment: occasional  . Drug use: No    No Known Allergies  Current Outpatient Medications  Medication Sig Dispense Refill  . lisinopril (PRINIVIL,ZESTRIL) 5 MG tablet Take 1 tablet (5 mg total) by mouth daily. (Patient taking differently: Take 5 mg by mouth as needed. ) 90 tablet 3  . rosuvastatin (CRESTOR) 10 MG tablet Take 1 tablet (10 mg total) by mouth daily. 90 tablet 3   No current facility-administered medications for this visit.     Review of Systems Review of Systems  Constitutional: Negative.   Respiratory: Negative.   Cardiovascular: Negative.     Blood pressure 128/78, pulse 70, resp. rate 12, height 5' 11.5" (1.816 m), weight 174 lb (78.9 kg), SpO2 97 %.  Physical  Exam Physical Exam  Constitutional: He is oriented to person, place, and time. He appears well-developed and well-nourished.  HENT:  Mouth/Throat: No oropharyngeal exudate.  Eyes: Conjunctivae are normal. No scleral icterus.  Neck: Neck supple.  Cardiovascular: Normal rate, regular rhythm and normal heart sounds.  Pulmonary/Chest: Effort normal and breath sounds normal.  Abdominal: Soft. Normal appearance and bowel sounds are normal. A hernia is present. Hernia confirmed positive in the right inguinal area. Hernia confirmed negative in the left inguinal area.  Left inguinal lipoma  Genitourinary:     Neurological: He is alert and oriented to person, place, and time.  Skin: Skin is warm and dry.  Lipoma left abdominal wall  Psychiatric: His behavior is normal.    Data Reviewed February 13, 2015 CT of the abdomen and pelvis for right flank pain showed evidence of a direct and indirect hernia with omentum.  No left-sided defect.  Assessment    Significant weight loss on keto diet.  Right inguinal hernia.  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    Plan    At this time, there is no clear evidence of left inguinal hernia.  I proposed we proceed  with planned open right inguinal hernia repair, planned excision of the suspected abdominal wall lipoma and banding of internal hemorrhoids if still present.  (Patient had declined colonoscopy in the past).      HPI, Physical Exam, Assessment and Plan have been scribed under the direction and in the presence of Robert Bellow, MD. Karie Fetch, RN   I have completed the exam and reviewed the above documentation for accuracy and completeness.  I agree with the above.  Haematologist has been used and any errors in dictation or transcription are unintentional.  Hervey Ard, M.D., F.A.C.S.  Forest Gleason Christeena Krogh 08/15/2018, 9:22 PM

## 2018-08-15 NOTE — Patient Instructions (Addendum)
The patient is aware to call back for any questions or concerns. Stool softener 3-4 days prior to surgery  Inguinal Hernia, Adult An inguinal hernia is when fat or the intestines push through the area where the leg meets the lower belly (groin) and make a rounded lump (bulge). This condition happens over time. There are three types of inguinal hernias. These types include:  Hernias that can be pushed back into the belly (are reducible).  Hernias that cannot be pushed back into the belly (are incarcerated).  Hernias that cannot be pushed back into the belly and lose their blood supply (get strangulated). This type needs emergency surgery.  Follow these instructions at home: Lifestyle  Drink enough fluid to keep your urine (pee) clear or pale yellow.  Eat plenty of fruits, vegetables, and whole grains. These have a lot of fiber. Talk with your doctor if you have questions.  Avoid lifting heavy objects.  Avoid standing for long periods of time.  Do not use tobacco products. These include cigarettes, chewing tobacco, or e-cigarettes. If you need help quitting, ask your doctor.  Try to stay at a healthy weight. General instructions  Do not try to force the hernia back in.  Watch your hernia for any changes in color or size. Let your doctor know if there are any changes.  Take over-the-counter and prescription medicines only as told by your doctor.  Keep all follow-up visits as told by your doctor. This is important. Contact a doctor if:  You have a fever.  You have new symptoms.  Your symptoms get worse. Get help right away if:  The area where the legs meets the lower belly has: ? Pain that gets worse suddenly. ? A bulge that gets bigger suddenly and does not go down. ? A bulge that turns red or purple. ? A bulge that is painful to the touch.  You are a man and your scrotum: ? Suddenly feels painful. ? Suddenly changes in size.  You feel sick to your stomach  (nauseous) and this feeling does not go away.  You throw up (vomit) and this keeps happening.  You feel your heart beating a lot more quickly than normal.  You cannot poop (have a bowel movement) or pass gas. This information is not intended to replace advice given to you by your health care provider. Make sure you discuss any questions you have with your health care provider. Document Released: 01/04/2007 Document Revised: 05/11/2016 Document Reviewed: 10/14/2014 Elsevier Interactive Patient Education  2018 [employer].

## 2018-08-26 ENCOUNTER — Encounter
Admission: RE | Admit: 2018-08-26 | Discharge: 2018-08-26 | Disposition: A | Discharge status: Home / Self Care | Payer: No Typology Code available for payment source | Source: Ambulatory Visit | Attending: General Surgery | Admitting: General Surgery

## 2018-08-26 ENCOUNTER — Other Ambulatory Visit: Payer: Self-pay

## 2018-08-26 HISTORY — DX: Personal history of urinary calculi: Z87.442

## 2018-08-26 HISTORY — DX: Essential (primary) hypertension: I10

## 2018-08-26 NOTE — Patient Instructions (Signed)
Your procedure is scheduled on: 09/02/18 Report to Day Surgery. [facility] SECOND FLOOR To find out your arrival time please call [phone] between 1PM - 3PM on 08/30/18  Remember: Instructions that are not followed completely may result in serious medical risk,  up to and including death, or upon the discretion of your surgeon and anesthesiologist your  surgery may need to be rescheduled.     _X__ 1. Do not eat food after midnight the night before your procedure.                 No gum chewing or hard candies. You may drink clear liquids up to 2 hours                 before you are scheduled to arrive for your surgery- DO not drink clear                 liquids within 2 hours of the start of your surgery.                 Clear Liquids include:  water, apple juice without pulp, clear carbohydrate                 drink such as Clearfast of Gatorade, Black Coffee or Tea (Do not add                 anything to coffee or tea).  __X__2.  On the morning of surgery brush your teeth with toothpaste and water, you                may rinse your mouth with mouthwash if you wish.  Do not swallow any toothpaste of mouthwash.     _X__ 3.  No Alcohol for 24 hours before or after surgery.   _X__ 4.  Do Not Smoke or use e-cigarettes For 24 Hours Prior to Your Surgery.                 Do not use any chewable tobacco products for at least 6 hours prior to                 surgery.  ____  5.  Bring all medications with you on the day of surgery if instructed.   __X__  6.  Notify your doctor if there is any change in your medical condition      (cold, fever, infections).     Do not wear jewelry, make-up, hairpins, clips or nail polish. Do not wear lotions, powders, or perfumes. You may wear deodorant. Do not shave 48 hours prior to surgery. Men may shave face and neck. Do not bring valuables to the hospital.    [facility] is not responsible for any belongings or  valuables.  Contacts, dentures or bridgework may not be worn into surgery. Leave your suitcase in the car. After surgery it may be brought to your room. For patients admitted to the hospital, discharge time is determined by your treatment team.   Patients discharged the day of surgery will not be allowed to drive home.    _X_ Take these medicines the morning of surgery with A SIP OF WATER:    1.CRESTOR  2.   3.   4.  5.  6.  ____ Fleet Enema (as directed)   ____ Use CHG Soap as directed  ____ Use inhalers on the day of surgery  ____ Stop metformin 2 days prior to surgery  ____ Take 1/2 of usual insulin dose the night before surgery. No insulin the morning          of surgery.   ____ Stop Coumadin/Plavix/aspirin on  ____ Stop Anti-inflammatories on        PATIENT STATES OK TO CONTINUE BY DR DYRNETT  ____ Stop supplements until after surgery.    ____ Bring C-Pap to the hospital.

## 2018-09-02 ENCOUNTER — Encounter
Admission: RE | Disposition: A | Discharge status: Home / Self Care | Payer: Self-pay | Source: Ambulatory Visit | Attending: General Surgery

## 2018-09-02 ENCOUNTER — Ambulatory Visit: Payer: No Typology Code available for payment source | Admitting: Registered Nurse

## 2018-09-02 ENCOUNTER — Ambulatory Visit
Admission: RE | Admit: 2018-09-02 | Discharge: 2018-09-02 | Disposition: A | Discharge status: Home / Self Care | Payer: No Typology Code available for payment source | Source: Ambulatory Visit | Attending: General Surgery | Admitting: General Surgery

## 2018-09-02 ENCOUNTER — Encounter: Payer: Self-pay | Admitting: *Deleted

## 2018-09-02 DIAGNOSIS — K4090 Unilateral inguinal hernia, without obstruction or gangrene, not specified as recurrent: Secondary | ICD-10-CM | POA: Insufficient documentation

## 2018-09-02 DIAGNOSIS — I10 Essential (primary) hypertension: Secondary | ICD-10-CM | POA: Diagnosis not present

## 2018-09-02 DIAGNOSIS — E785 Hyperlipidemia, unspecified: Secondary | ICD-10-CM | POA: Diagnosis not present

## 2018-09-02 DIAGNOSIS — Z79899 Other long term (current) drug therapy: Secondary | ICD-10-CM | POA: Insufficient documentation

## 2018-09-02 DIAGNOSIS — K648 Other hemorrhoids: Secondary | ICD-10-CM | POA: Diagnosis not present

## 2018-09-02 DIAGNOSIS — D171 Benign lipomatous neoplasm of skin and subcutaneous tissue of trunk: Secondary | ICD-10-CM | POA: Insufficient documentation

## 2018-09-02 DIAGNOSIS — K641 Second degree hemorrhoids: Secondary | ICD-10-CM | POA: Diagnosis not present

## 2018-09-02 HISTORY — PX: INGUINAL HERNIA REPAIR: SHX194

## 2018-09-02 HISTORY — PX: HEMORRHOID SURGERY: SHX153

## 2018-09-02 HISTORY — PX: LIPOMA EXCISION: SHX5283

## 2018-09-02 SURGERY — REPAIR, HERNIA, INGUINAL, ADULT
Anesthesia: General | Laterality: Right

## 2018-09-02 MED ORDER — FENTANYL CITRATE (PF) 100 MCG/2ML IJ SOLN
INTRAMUSCULAR | Status: DC | PRN
  Administered 2018-09-02 (×2): 50 ug via INTRAVENOUS

## 2018-09-02 MED ORDER — OXYCODONE HCL 5 MG/5ML PO SOLN: 5.0000 mg | Freq: Once | ORAL | Status: DC | PRN

## 2018-09-02 MED ORDER — EPHEDRINE SULFATE 50 MG/ML IJ SOLN
INTRAMUSCULAR | Status: AC
  Filled 2018-09-02: qty 1

## 2018-09-02 MED ORDER — GLYCOPYRROLATE 0.2 MG/ML IJ SOLN
INTRAMUSCULAR | Status: DC | PRN
  Administered 2018-09-02: 0.2 mg via INTRAVENOUS

## 2018-09-02 MED ORDER — GLYCOPYRROLATE 0.2 MG/ML IJ SOLN
INTRAMUSCULAR | Status: AC
  Filled 2018-09-02: qty 1

## 2018-09-02 MED ORDER — LIDOCAINE HCL (CARDIAC) PF 100 MG/5ML IV SOSY
PREFILLED_SYRINGE | INTRAVENOUS | Status: DC | PRN
  Administered 2018-09-02: 100 mg via INTRAVENOUS

## 2018-09-02 MED ORDER — LIDOCAINE HCL (PF) 2 % IJ SOLN
INTRAMUSCULAR | Status: AC
  Filled 2018-09-02: qty 10

## 2018-09-02 MED ORDER — HYDROCODONE-ACETAMINOPHEN 5-325 MG PO TABS: 1.0000 | ORAL_TABLET | ORAL | 0 refills | Status: DC | PRN

## 2018-09-02 MED ORDER — ACETAMINOPHEN 10 MG/ML IV SOLN
INTRAVENOUS | Status: AC
  Administered 2018-09-02: 1000 mg via INTRAVENOUS
  Filled 2018-09-02: qty 100

## 2018-09-02 MED ORDER — FENTANYL CITRATE (PF) 100 MCG/2ML IJ SOLN
INTRAMUSCULAR | Status: AC
  Filled 2018-09-02: qty 2

## 2018-09-02 MED ORDER — DEXAMETHASONE SODIUM PHOSPHATE 10 MG/ML IJ SOLN
INTRAMUSCULAR | Status: AC
  Filled 2018-09-02: qty 1

## 2018-09-02 MED ORDER — OXYCODONE HCL 5 MG PO TABS: 5.0000 mg | ORAL_TABLET | Freq: Once | ORAL | Status: DC | PRN

## 2018-09-02 MED ORDER — FAMOTIDINE 20 MG PO TABS
ORAL_TABLET | ORAL | Status: AC
  Administered 2018-09-02: 20 mg via ORAL
  Filled 2018-09-02: qty 1

## 2018-09-02 MED ORDER — SUCCINYLCHOLINE CHLORIDE 20 MG/ML IJ SOLN
INTRAMUSCULAR | Status: AC
  Filled 2018-09-02: qty 1

## 2018-09-02 MED ORDER — MIDAZOLAM HCL 2 MG/2ML IJ SOLN
INTRAMUSCULAR | Status: AC
  Filled 2018-09-02: qty 2

## 2018-09-02 MED ORDER — KETOROLAC TROMETHAMINE 30 MG/ML IJ SOLN
INTRAMUSCULAR | Status: DC | PRN
  Administered 2018-09-02: 30 mg via INTRAVENOUS

## 2018-09-02 MED ORDER — BUPIVACAINE-EPINEPHRINE (PF) 0.5% -1:200000 IJ SOLN
INTRAMUSCULAR | Status: AC
  Filled 2018-09-02: qty 30

## 2018-09-02 MED ORDER — ONDANSETRON HCL 4 MG/2ML IJ SOLN
INTRAMUSCULAR | Status: AC
  Filled 2018-09-02: qty 2

## 2018-09-02 MED ORDER — PROPOFOL 10 MG/ML IV BOLUS
INTRAVENOUS | Status: DC | PRN
  Administered 2018-09-02: 20 mg via INTRAVENOUS
  Administered 2018-09-02: 140 mg via INTRAVENOUS

## 2018-09-02 MED ORDER — ONDANSETRON HCL 4 MG/2ML IJ SOLN
INTRAMUSCULAR | Status: DC | PRN
  Administered 2018-09-02: 4 mg via INTRAVENOUS

## 2018-09-02 MED ORDER — ACETAMINOPHEN 10 MG/ML IV SOLN
1000.0000 mg | Freq: Once | INTRAVENOUS | Status: AC
  Administered 2018-09-02: 1000 mg via INTRAVENOUS

## 2018-09-02 MED ORDER — CEFAZOLIN SODIUM-DEXTROSE 2-4 GM/100ML-% IV SOLN
INTRAVENOUS | Status: AC
  Filled 2018-09-02: qty 100

## 2018-09-02 MED ORDER — KETOROLAC TROMETHAMINE 30 MG/ML IJ SOLN
INTRAMUSCULAR | Status: AC
  Filled 2018-09-02: qty 1

## 2018-09-02 MED ORDER — ACETAMINOPHEN 10 MG/ML IV SOLN
INTRAVENOUS | Status: AC
  Filled 2018-09-02: qty 100

## 2018-09-02 MED ORDER — MIDAZOLAM HCL 2 MG/2ML IJ SOLN
INTRAMUSCULAR | Status: DC | PRN
  Administered 2018-09-02: 2 mg via INTRAVENOUS

## 2018-09-02 MED ORDER — PROPOFOL 10 MG/ML IV BOLUS
INTRAVENOUS | Status: AC
  Filled 2018-09-02: qty 20

## 2018-09-02 MED ORDER — GABAPENTIN 300 MG PO CAPS
ORAL_CAPSULE | ORAL | Status: AC
  Filled 2018-09-02: qty 1

## 2018-09-02 MED ORDER — EPHEDRINE SULFATE 50 MG/ML IJ SOLN
INTRAMUSCULAR | Status: DC | PRN
  Administered 2018-09-02: 5 mg via INTRAVENOUS
  Administered 2018-09-02: 10 mg via INTRAVENOUS

## 2018-09-02 MED ORDER — BUPIVACAINE-EPINEPHRINE (PF) 0.5% -1:200000 IJ SOLN
INTRAMUSCULAR | Status: DC | PRN
  Administered 2018-09-02: 20 mL
  Administered 2018-09-02: 10 mL

## 2018-09-02 MED ORDER — DEXAMETHASONE SODIUM PHOSPHATE 10 MG/ML IJ SOLN
INTRAMUSCULAR | Status: DC | PRN
  Administered 2018-09-02: 10 mg via INTRAVENOUS

## 2018-09-02 MED ORDER — LACTATED RINGERS IV SOLN
INTRAVENOUS | Status: DC
  Administered 2018-09-02: 75 mL/h via INTRAVENOUS

## 2018-09-02 MED ORDER — FAMOTIDINE 20 MG PO TABS
20.0000 mg | ORAL_TABLET | Freq: Once | ORAL | Status: AC
  Administered 2018-09-02: 20 mg via ORAL

## 2018-09-02 MED ORDER — FENTANYL CITRATE (PF) 100 MCG/2ML IJ SOLN: 25.0000 ug | INTRAMUSCULAR | Status: DC | PRN

## 2018-09-02 MED ORDER — PHENYLEPHRINE HCL 10 MG/ML IJ SOLN
INTRAMUSCULAR | Status: AC
  Filled 2018-09-02: qty 1

## 2018-09-02 MED ORDER — CEFAZOLIN SODIUM-DEXTROSE 2-4 GM/100ML-% IV SOLN
2.0000 g | INTRAVENOUS | Status: AC
  Administered 2018-09-02: 2 g via INTRAVENOUS

## 2018-09-02 MED ORDER — GABAPENTIN 300 MG PO CAPS
300.0000 mg | ORAL_CAPSULE | ORAL | Status: AC
  Administered 2018-09-02: 300 mg via ORAL

## 2018-09-02 SURGICAL SUPPLY — 45 items
BLADE SURG 15 STRL SS SAFETY (BLADE) ×10 IMPLANT
BRIEF STRETCH MATERNITY 2XLG (MISCELLANEOUS) ×5 IMPLANT
CANISTER SUCT 1200ML W/VALVE (MISCELLANEOUS) ×5 IMPLANT
CHLORAPREP W/TINT 26ML (MISCELLANEOUS) ×5 IMPLANT
CLOSURE WOUND 1/2 X4 (GAUZE/BANDAGES/DRESSINGS) ×1
DECANTER SPIKE VIAL GLASS SM (MISCELLANEOUS) ×5 IMPLANT
DRAIN PENROSE 1/4X12 LTX (DRAIN) ×5 IMPLANT
DRAPE LAPAROTOMY 100X77 ABD (DRAPES) ×5 IMPLANT
DRAPE LEGGINS SURG 28X43 STRL (DRAPES) ×5 IMPLANT
DRAPE UNDER BUTTOCK W/FLU (DRAPES) ×5 IMPLANT
DRSG GAUZE PETRO 6X36 STRIP ST (GAUZE/BANDAGES/DRESSINGS) ×5 IMPLANT
DRSG TEGADERM 4X4.75 (GAUZE/BANDAGES/DRESSINGS) ×5 IMPLANT
DRSG TELFA 4X3 1S NADH ST (GAUZE/BANDAGES/DRESSINGS) ×5 IMPLANT
ELECT REM PT RETURN 9FT ADLT (ELECTROSURGICAL) ×5
ELECTRODE REM PT RTRN 9FT ADLT (ELECTROSURGICAL) ×3 IMPLANT
GLOVE BIO SURGEON STRL SZ7.5 (GLOVE) ×5 IMPLANT
GLOVE INDICATOR 8.0 STRL GRN (GLOVE) ×5 IMPLANT
GOWN STRL REUS W/ TWL LRG LVL3 (GOWN DISPOSABLE) ×6 IMPLANT
GOWN STRL REUS W/TWL LRG LVL3 (GOWN DISPOSABLE) ×4
KIT TURNOVER KIT A (KITS) ×5 IMPLANT
LABEL OR SOLS (LABEL) ×5 IMPLANT
MESH HERNIA 6X12 ULTRAPRO MED (Mesh General) ×3 IMPLANT
MESH HERNIA ULTRAPRO MED (Mesh General) ×2 IMPLANT
NEEDLE HYPO 22GX1.5 SAFETY (NEEDLE) ×10 IMPLANT
NEEDLE HYPO 25X1 1.5 SAFETY (NEEDLE) ×5 IMPLANT
PACK BASIN MINOR ARMC (MISCELLANEOUS) ×5 IMPLANT
PAD OB MATERNITY 4.3X12.25 (PERSONAL CARE ITEMS) ×5 IMPLANT
PAD PREP 24X41 OB/GYN DISP (PERSONAL CARE ITEMS) ×5 IMPLANT
SHEARS FOC LG CVD HARMONIC 17C (MISCELLANEOUS) IMPLANT
STRIP CLOSURE SKIN 1/2X4 (GAUZE/BANDAGES/DRESSINGS) ×4 IMPLANT
SURGILUBE 2OZ TUBE FLIPTOP (MISCELLANEOUS) ×5 IMPLANT
SUT CHROMIC 3 0 SH 27 (SUTURE) ×5 IMPLANT
SUT PDS AB 0 CT1 27 (SUTURE) ×5 IMPLANT
SUT SILK 0 CT 1 30 (SUTURE) ×5 IMPLANT
SUT SURGILON 0 BLK (SUTURE) ×10 IMPLANT
SUT VIC AB 2-0 SH 27 (SUTURE) ×2
SUT VIC AB 2-0 SH 27XBRD (SUTURE) ×3 IMPLANT
SUT VIC AB 3-0 54X BRD REEL (SUTURE) ×3 IMPLANT
SUT VIC AB 3-0 BRD 54 (SUTURE) ×2
SUT VIC AB 3-0 SH 27 (SUTURE) ×2
SUT VIC AB 3-0 SH 27X BRD (SUTURE) ×3 IMPLANT
SUT VIC AB 4-0 FS2 27 (SUTURE) ×5 IMPLANT
SWABSTK COMLB BENZOIN TINCTURE (MISCELLANEOUS) ×5 IMPLANT
SYR 10ML LL (SYRINGE) ×10 IMPLANT
SYR 3ML LL SCALE MARK (SYRINGE) ×5 IMPLANT

## 2018-09-02 NOTE — Anesthesia Procedure Notes (Signed)
Procedure Name: LMA Insertion Date/Time: 09/02/2018 7:58 AM Performed by: Doreen Salvage, CRNA Pre-anesthesia Checklist: Patient identified, Patient being monitored, Timeout performed, Emergency Drugs available and Suction available Patient Re-evaluated:Patient Re-evaluated prior to induction Oxygen Delivery Method: Circle system utilized Preoxygenation: Pre-oxygenation with 100% oxygen Induction Type: IV induction Ventilation: Mask ventilation without difficulty LMA: LMA inserted LMA Size: 3.5 Tube type: Oral Number of attempts: 1 Placement Confirmation: positive ETCO2 and breath sounds checked- equal and bilateral Tube secured with: Tape Dental Injury: Teeth and Oropharynx as per pre-operative assessment

## 2018-09-02 NOTE — H&P (Signed)
Chris Hernandez [id number] [birth year]     HPI:  47 y/o for inguinal hernia repair and lipoma excision.   Medications Prior to Admission  Medication Sig Dispense Refill Last Dose  . ibuprofen (ADVIL,MOTRIN) 200 MG tablet Take 400 mg by mouth daily as needed (for headaches.).   Past Week at Unknown time  . lisinopril (PRINIVIL,ZESTRIL) 5 MG tablet Take 1 tablet (5 mg total) by mouth daily. (Patient taking differently: Take 5 mg by mouth every evening. ) 90 tablet 3 09/01/2018 at 2000  . rosuvastatin (CRESTOR) 10 MG tablet Take 1 tablet (10 mg total) by mouth daily. 90 tablet 3 09/01/2018 at 0800   No Known Allergies Past Medical History:  Diagnosis Date  . Acid reflux    not for last 7 years  . History of kidney stones   . Hyperlipidemia   . Hypertension    WEIGHT LOSS/ MED PRN  . Kidney stones 2013   kidney stones   Past Surgical History:  Procedure Laterality Date  . CYST REMOVAL PEDIATRIC     Throat  . gerd     ENDOSCOPY  . WISDOM TOOTH EXTRACTION     Social History   Socioeconomic History  . Marital status: Married    Spouse name: misty  . Number of children: Not on file  . Years of education: Not on file  . Highest education level: Not on file  Occupational History  . Occupation: IT   Social Needs  . Financial resource strain: Not on file  . Food insecurity:    Worry: Not on file    Inability: Not on file  . Transportation needs:    Medical: Not on file    Non-medical: Not on file  Tobacco Use  . Smoking status: Never Smoker  . Smokeless tobacco: Never Used  Substance and Sexual Activity  . Alcohol use: Yes    Comment: occasional  . Drug use: No  . Sexual activity: Not on file  Lifestyle  . Physical activity:    Days per week: Not on file    Minutes per session: Not on file  . Stress: Not on file  Relationships  . Social connections:    Talks on phone: Not on file    Gets together: Not on file    Attends religious service: Not on file   Active member of club or organization: Not on file    Attends meetings of clubs or organizations: Not on file    Relationship status: Not on file  . Intimate partner violence:    Fear of current or ex partner: Not on file    Emotionally abused: Not on file    Physically abused: Not on file    Forced sexual activity: Not on file  Other Topics Concern  . Not on file  Social History Narrative   Lives in [place] with wife and 2 kids 8YO and [place].      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     PE: HEENT: Negative. Lungs: Clear. Cardio: RR. Abdomen: RIH, left groin lipoma.   Assessment/Plan:  Proceed with planned right inguinal hernia repair, excision left abdominal wall lipoma, hemorrhoid banding.    Forest Gleason [facility] 09/02/2018

## 2018-09-02 NOTE — Discharge Instructions (Signed)

## 2018-09-02 NOTE — Anesthesia Post-op Follow-up Note (Signed)
Anesthesia QCDR form completed.        

## 2018-09-02 NOTE — Transfer of Care (Signed)
Immediate Anesthesia Transfer of Care Note  Patient: Chris Hernandez  Procedure(s) Performed: HERNIA REPAIR INGUINAL ADULT (Right ) HEMORRHOID BANDING (N/A ) EXCISION LIPOMA (Left )  Patient Location: PACU  Anesthesia Type:General  Level of Consciousness: drowsy  Airway & Oxygen Therapy: Patient Spontanous Breathing and Patient connected to face mask oxygen  Post-op Assessment: Report given to RN and Post -op Vital signs reviewed and stable  Post vital signs: Reviewed and stable  Last Vitals:  Vitals Value Taken Time  BP    Temp    Pulse    Resp    SpO2      Last Pain:  Vitals:   09/02/18 0642  TempSrc: Oral  PainSc: 0-No pain      Patients Stated Pain Goal: 0 (92/17/83 7542)  Complications: No apparent anesthesia complications

## 2018-09-02 NOTE — Anesthesia Postprocedure Evaluation (Signed)
Anesthesia Post Note  Patient: Chris Hernandez  Procedure(s) Performed: HERNIA REPAIR INGUINAL ADULT (Right ) HEMORRHOID BANDING (N/A ) EXCISION LIPOMA (Left )  Patient location during evaluation: PACU Anesthesia Type: General Level of consciousness: awake and alert Pain management: pain level controlled Vital Signs Assessment: post-procedure vital signs reviewed and stable Respiratory status: spontaneous breathing and respiratory function stable Cardiovascular status: stable Anesthetic complications: no     Last Vitals:  Vitals:   09/02/18 0642 09/02/18 0914  BP: 125/85   Pulse: 67   Resp: 14   Temp: 36.4 C (!) (P) 36.4 C  SpO2: 100%     Last Pain:  Vitals:   09/02/18 0642  TempSrc: Oral  PainSc: 0-No pain                 Bitha Fauteux K

## 2018-09-02 NOTE — OR Nursing (Signed)
Discharge instructions discussed with pt and wife. Both voice understanding. 

## 2018-09-02 NOTE — Anesthesia Preprocedure Evaluation (Signed)
Anesthesia Evaluation  Patient identified by MRN, date of birth, ID band Patient awake    Reviewed: Allergy & Precautions, H&P , NPO status , Patient's Chart, lab work & pertinent test results  History of Anesthesia Complications Negative for: history of anesthetic complications  Airway Mallampati: II  TM Distance: >3 FB Neck ROM: full    Dental  (+) Chipped   Pulmonary neg pulmonary ROS, neg shortness of breath,           Cardiovascular Exercise Tolerance: Good hypertension, (-) angina(-) Past MI and (-) DOE      Neuro/Psych negative neurological ROS  negative psych ROS   GI/Hepatic Neg liver ROS, GERD  ,  Endo/Other  negative endocrine ROS  Renal/GU Renal disease     Musculoskeletal   Abdominal   Peds  Hematology negative hematology ROS (+)   Anesthesia Other Findings Past Medical History: No date: Acid reflux     Comment:  not for last 7 years No date: History of kidney stones No date: Hyperlipidemia No date: Hypertension     Comment:  WEIGHT LOSS/ MED PRN 2013: Kidney stones     Comment:  kidney stones  Past Surgical History: No date: CYST REMOVAL PEDIATRIC     Comment:  Throat No date: gerd     Comment:  ENDOSCOPY No date: WISDOM TOOTH EXTRACTION  BMI    Body Mass Index:  22.97 kg/m      Reproductive/Obstetrics negative OB ROS                             Anesthesia Physical Anesthesia Plan  ASA: III  Anesthesia Plan: General LMA   Post-op Pain Management:    Induction: Intravenous  PONV Risk Score and Plan: Ondansetron, Dexamethasone and Midazolam  Airway Management Planned: LMA  Additional Equipment:   Intra-op Plan:   Post-operative Plan: Extubation in OR  Informed Consent: I have reviewed the patients History and Physical, chart, labs and discussed the procedure including the risks, benefits and alternatives for the proposed anesthesia with the  patient or authorized representative who has indicated his/her understanding and acceptance.   Dental Advisory Given  Plan Discussed with: Anesthesiologist, CRNA and Surgeon  Anesthesia Plan Comments: (Patient consented for risks of anesthesia including but not limited to:  - adverse reactions to medications - damage to teeth, lips or other oral mucosa - sore throat or hoarseness - Damage to heart, brain, lungs or loss of life  Patient voiced understanding.)        Anesthesia Quick Evaluation

## 2018-09-02 NOTE — Op Note (Signed)
Preoperative diagnosis: 1) right inguinal hernia; 2) left lower abdominal wall lipoma; 3) internal hemorrhoid.  Postoperative diagnosis: Same.  Operative procedure: 1) repair of right direct and indirect inguinal hernia with medium Ultra Pro mesh; 2) excision left lower abdominal wall lipoma; 3) banding left anterior internal hemorrhoid.  Operating Surgeon: Hervey Ard, MD.  Anesthesia: General by LMA; Marcaine with epinephrine, 1 to 200,000 units, 30 cc; Toradol: 30 mg.  Estimated blood loss: Less than 5 cc.  Clinical note: This 47 year old male is developed a symptomatic right inguinal hernia and is also noted a soft tissue mass in the abdominal wall on the left thought to represent a lipoma.  He has had bleeding from his internal hemorrhoid and was admitted for hemorrhoidectomy (banding) and hernia repair.  Patient received Kefzol prior to the procedure.  Operative note: The patient underwent general anesthesia without difficulty.  The abdomen was cleansed with ChloraPrep and draped.  Field block anesthesia was established on the right side and local anesthesia infiltrated over the mass previously marked in the standing position on the left side.  A 5 cm skin line incision along the anticipated course the inguinal canal was carried down through skin subtendinous tissue with hemostasis achieved with electrocautery.  The external Bleich was opened in direction of its fibers.  The ilioinguinal and iliohypogastric nerves were identified and protected.  The patient was found to have significant amount of scarring of an indirect sac to the cord and this was dissected free and return to the preperitoneal space clearing the undersurface of the fascia.  There was a generous sized direct hernia as well and this was freed circumferentially and it was found to be coming through just a 1 cm opening.  The preperitoneal space was cleared and a medium ultra Pro mesh smooth to cover both the direct defect and  the internal ring.  The external component was laid along the floor the canal and anchored to the pubic tubercle with 0 Surgilon suture.  The inferior aspect of the mesh was anchored to the inguinal ligament and the medial and superior borders to the transverse abdominis aponeurosis with interrupted 0 Surgilon sutures.  A lateral slit was made for cord passage and then closed.  Toradol was placed in the wound.  The external Bleich was closed with a running 2-0 Vicryl suture.  Scarpa's fascia was closed with a running 3-0 Vicryl suture and the skin closed with a running 4-0 Vicryl sub-cuticular suture.  Attention was turned to the left side where the marking had been completed in the standing position.  A skin line incision was made and a area of prominent adipose tissue was excised this was above the level of the external Bleich fascia.  The defect was closed with a running 3-0 Vicryl suture and the skin closed with a running 4-0 Vicryl subcuticular suture.  Benzoin Steri-Strips followed by Telfa and Tegaderm dressings were applied to both abdominal wounds.  The patient was placed in the frog-leg position and the prolapsing internal hemorrhoid was grasped and treated with double rubber bands with a Barron's hemorrhoid banding device.  The patient tolerated the procedure well and was taken to recovery room in stable condition.

## 2018-09-05 LAB — SURGICAL PATHOLOGY

## 2018-09-17 ENCOUNTER — Ambulatory Visit (INDEPENDENT_AMBULATORY_CARE_PROVIDER_SITE_OTHER): Payer: No Typology Code available for payment source | Admitting: General Surgery

## 2018-09-17 ENCOUNTER — Encounter: Payer: Self-pay | Admitting: General Surgery

## 2018-09-17 VITALS — BP 120/74 | HR 68 | Resp 12 | Ht 71.0 in | Wt 171.0 lb

## 2018-09-17 DIAGNOSIS — K648 Other hemorrhoids: Secondary | ICD-10-CM

## 2018-09-17 DIAGNOSIS — K4090 Unilateral inguinal hernia, without obstruction or gangrene, not specified as recurrent: Secondary | ICD-10-CM

## 2018-09-17 NOTE — Progress Notes (Signed)
Patient ID: Chris Hernandez, male   DOB: 1971/12/02, 47 y.o.   MRN: [id number]  Chief Complaint  Patient presents with  . Routine Post Op    HPI Chris Hernandez is a 47 y.o. male here today for his post op right  inguinal hernia repair done on 09/02/2018 and hemorrhoid banding . Marland KitchenPatient states he is doing well.  No HPI  Past Medical History:  Diagnosis Date  . Acid reflux    not for last 7 years  . History of kidney stones   . Hyperlipidemia   . Hypertension    WEIGHT LOSS/ MED PRN  . Kidney stones 2013   kidney stones    Past Surgical History:  Procedure Laterality Date  . CYST REMOVAL PEDIATRIC     Throat  . gerd     ENDOSCOPY  . HEMORRHOID SURGERY N/A 09/02/2018   Procedure: Thayer Jew;  Surgeon: Robert Bellow, MD;  Location: [facility] ORS;  Service: General;  Laterality: N/A;  . INGUINAL HERNIA REPAIR Right 09/02/2018   Right direct and indirect inguinal hernia repair with medium Ultra Pro mesh; Surgeon: Robert Bellow, MD;  Location: [facility] ORS;  Service: General;  Laterality: Right;  . LIPOMA EXCISION Left 09/02/2018   Excision left lower abdominal wall lipoma  . WISDOM TOOTH EXTRACTION      Family History  Problem Relation Age of Onset  . Arthritis Mother   . Hypertension Mother   . Colon polyps Mother 55  . Heart disease Father 84  . Hypertension Father   . Cancer Maternal Aunt        Breast  . Hypertension Maternal Grandmother   . Arthritis Maternal Grandmother        rheumatoid  . Cancer Maternal Aunt        Breast  . Diabetes Maternal Aunt     Social History Social History   Tobacco Use  . Smoking status: Never Smoker  . Smokeless tobacco: Never Used  Substance Use Topics  . Alcohol use: Yes    Comment: occasional  . Drug use: No    No Known Allergies  Current Outpatient Medications  Medication Sig Dispense Refill  . lisinopril (PRINIVIL,ZESTRIL) 5 MG tablet Take 1 tablet (5 mg total) by mouth daily. (Patient taking  differently: Take 5 mg by mouth every evening. ) 90 tablet 3  . rosuvastatin (CRESTOR) 10 MG tablet Take 1 tablet (10 mg total) by mouth daily. 90 tablet 3   No current facility-administered medications for this visit.     Review of Systems Review of Systems  Constitutional: Negative.   Respiratory: Negative.   Cardiovascular: Negative.     Blood pressure 120/74, pulse 68, resp. rate 12, height 5\' 11"  (1.803 m), weight 171 lb (77.6 kg).  Physical Exam Physical Exam  Genitourinary:            Assessment    Doing well post right inguinal hernia repair, excision of left abdominal wall lipoma and internal hemorrhoid banding.    Plan  The patient has had some intermittent stinging with defecation, no evidence of fissure or fistula on today's exam.  Should he have persistent symptoms or develop recurrent prolapsing tissue he is been asked to call for reassessment and potentially repeat banding versus formal hemorrhoidectomy.  Resume activities as tolerated.Return as needed. The patient is aware to call back for any questions or concerns.   HPI, Physical Exam, Assessment and Plan have been scribed under the direction and in the  presence of Hervey Ard, MD.  Gaspar Cola, CMA  I have completed the exam and reviewed the above documentation for accuracy and completeness.  I agree with the above.  Haematologist has been used and any errors in dictation or transcription are unintentional.  Hervey Ard, M.D., F.A.C.S.  Forest Gleason Nasha Diss 09/18/2018, 8:36 PM

## 2018-09-17 NOTE — Patient Instructions (Signed)
Resume activities as tolerated.Return as needed. The patient is aware to call back for any questions or concerns.

## 2018-09-18 ENCOUNTER — Encounter: Payer: Self-pay | Admitting: General Surgery

## 2018-12-04 ENCOUNTER — Other Ambulatory Visit: Payer: Self-pay | Admitting: Family

## 2018-12-04 DIAGNOSIS — I10 Essential (primary) hypertension: Secondary | ICD-10-CM

## 2018-12-12 ENCOUNTER — Encounter: Payer: Self-pay | Admitting: Family

## 2018-12-13 ENCOUNTER — Other Ambulatory Visit: Payer: Self-pay | Admitting: Family

## 2018-12-13 DIAGNOSIS — L989 Disorder of the skin and subcutaneous tissue, unspecified: Secondary | ICD-10-CM

## 2019-05-14 ENCOUNTER — Other Ambulatory Visit: Payer: Self-pay | Admitting: Family

## 2019-05-14 DIAGNOSIS — E785 Hyperlipidemia, unspecified: Secondary | ICD-10-CM

## 2019-08-18 ENCOUNTER — Other Ambulatory Visit: Payer: Self-pay

## 2019-08-20 ENCOUNTER — Encounter: Payer: Self-pay | Admitting: Family

## 2019-08-20 ENCOUNTER — Other Ambulatory Visit: Payer: Self-pay

## 2019-08-20 ENCOUNTER — Ambulatory Visit (INDEPENDENT_AMBULATORY_CARE_PROVIDER_SITE_OTHER): Payer: No Typology Code available for payment source | Admitting: Family

## 2019-08-20 VITALS — BP 120/76 | HR 73 | Temp 98.0°F | Ht 70.0 in | Wt 178.6 lb

## 2019-08-20 DIAGNOSIS — Z125 Encounter for screening for malignant neoplasm of prostate: Secondary | ICD-10-CM

## 2019-08-20 DIAGNOSIS — D171 Benign lipomatous neoplasm of skin and subcutaneous tissue of trunk: Secondary | ICD-10-CM

## 2019-08-20 DIAGNOSIS — I10 Essential (primary) hypertension: Secondary | ICD-10-CM | POA: Diagnosis not present

## 2019-08-20 DIAGNOSIS — Z0000 Encounter for general adult medical examination without abnormal findings: Secondary | ICD-10-CM | POA: Diagnosis not present

## 2019-08-20 LAB — CBC WITH DIFFERENTIAL/PLATELET
Basophils Absolute: 0.1 10*3/uL (ref 0.0–0.1)
Basophils Relative: 0.9 % (ref 0.0–3.0)
Eosinophils Absolute: 0.1 10*3/uL (ref 0.0–0.7)
Eosinophils Relative: 1.9 % (ref 0.0–5.0)
HCT: 44.1 % (ref 39.0–52.0)
Hemoglobin: 15.2 g/dL (ref 13.0–17.0)
Lymphocytes Relative: 28.2 % (ref 12.0–46.0)
Lymphs Abs: 1.7 10*3/uL (ref 0.7–4.0)
MCHC: 34.5 g/dL (ref 30.0–36.0)
MCV: 85.6 fl (ref 78.0–100.0)
Monocytes Absolute: 0.6 10*3/uL (ref 0.1–1.0)
Monocytes Relative: 10.3 % (ref 3.0–12.0)
Neutro Abs: 3.6 10*3/uL (ref 1.4–7.7)
Neutrophils Relative %: 58.7 % (ref 43.0–77.0)
Platelets: 157 10*3/uL (ref 150.0–400.0)
RBC: 5.15 Mil/uL (ref 4.22–5.81)
RDW: 14.2 % (ref 11.5–15.5)
WBC: 6.1 10*3/uL (ref 4.0–10.5)

## 2019-08-20 LAB — LIPID PANEL
Cholesterol: 171 mg/dL (ref 0–200)
HDL: 43.2 mg/dL (ref >39.00)
LDL Cholesterol: 107 mg/dL — ABNORMAL HIGH (ref 0–99)
NonHDL: 127.83
Total CHOL/HDL Ratio: 4
Triglycerides: 104 mg/dL (ref 0.0–149.0)
VLDL: 20.8 mg/dL (ref 0.0–40.0)

## 2019-08-20 LAB — COMPREHENSIVE METABOLIC PANEL
ALT: 14 U/L (ref 0–53)
AST: 14 U/L (ref 0–37)
Albumin: 4.7 g/dL (ref 3.5–5.2)
Alkaline Phosphatase: 41 U/L (ref 39–117)
BUN: 14 mg/dL (ref 6–23)
CO2: 28 mEq/L (ref 19–32)
Calcium: 9.4 mg/dL (ref 8.4–10.5)
Chloride: 104 mEq/L (ref 96–112)
Creatinine, Ser: 0.92 mg/dL (ref 0.40–1.50)
GFR: 87.41 mL/min (ref >60.00)
Glucose, Bld: 87 mg/dL (ref 70–99)
Potassium: 4.1 mEq/L (ref 3.5–5.1)
Sodium: 139 mEq/L (ref 135–145)
Total Bilirubin: 0.7 mg/dL (ref 0.2–1.2)
Total Protein: 7 g/dL (ref 6.0–8.3)

## 2019-08-20 LAB — TSH: TSH: 1.9 u[IU]/mL (ref 0.35–4.50)

## 2019-08-20 LAB — PSA: PSA: 0.51 ng/mL (ref 0.10–4.00)

## 2019-08-20 LAB — VITAMIN D 25 HYDROXY (VIT D DEFICIENCY, FRACTURES): VITD: 26.06 ng/mL — ABNORMAL LOW (ref 30.00–100.00)

## 2019-08-20 LAB — HEMOGLOBIN A1C: Hgb A1c MFr Bld: 5.4 % (ref 4.6–6.5)

## 2019-08-20 NOTE — Progress Notes (Signed)
Subjective:    Patient ID: Chris Hernandez, male    DOB: January 22, 1971, 48 y.o.   MRN: [id number]  CC: Chris Hernandez is a 48 y.o. male who presents today for physical exam.    HPI: Feels well today. Patient had hernia repair with Dr. Terri Piedra for bilateral inguinal hernias.  At that time noted a left non tender 'fat pad'which he would still like to be evaluated.  Per Dr Terri Piedra, 09/2018, lipoma.   It is not painful however bothersome aesthetically.  Following keto diet has lost 20 pounds over last year and maintained weight loss.  Compliant with lisinopril, crestor.   Many years ago states had a normal stress test      Colorectal  Cancer Screening: Mother has polyps.  Prostate Cancer Screening: Pending psa. No Urinary hesitancy or reduced stream.No prostate cancer family history.  Lung Cancer Screening: No 30 year pack year history and > 55 years. Immunizations       Tetanus - utd         Labs: Screening labs today. Exercise: Gets regular exercise.  Walks 45 minutes every day, occasional weights.  No chest pain Alcohol use: occasional Smoking/tobacco use: Nonsmoker.  Skin: Follows annually with [facility] Wears seat belt: Yes.  HISTORY:  Past Medical History:  Diagnosis Date  . Acid reflux    not for last 7 years  . History of kidney stones   . Hyperlipidemia   . Hypertension    WEIGHT LOSS/ MED PRN  . Kidney stones 2013   kidney stones    Past Surgical History:  Procedure Laterality Date  . CYST REMOVAL PEDIATRIC     Throat  . gerd     ENDOSCOPY  . HEMORRHOID SURGERY N/A 09/02/2018   Procedure: Thayer Jew;  Surgeon: Robert Bellow, MD;  Location: [facility] ORS;  Service: General;  Laterality: N/A;  . INGUINAL HERNIA REPAIR Right 09/02/2018   Right direct and indirect inguinal hernia repair with medium Ultra Pro mesh; Surgeon: Robert Bellow, MD;  Location: [facility] ORS;  Service: General;  Laterality: Right;  . LIPOMA EXCISION Left  09/02/2018   Excision left lower abdominal wall lipoma  . WISDOM TOOTH EXTRACTION     Family History  Problem Relation Age of Onset  . Arthritis Mother   . Hypertension Mother   . Colon polyps Mother 70  . Heart disease Father 93  . Hypertension Father   . Cancer Maternal Aunt        Breast  . Hypertension Maternal Grandmother   . Arthritis Maternal Grandmother        rheumatoid  . Cancer Maternal Aunt        Breast  . Diabetes Maternal Aunt       ALLERGIES: Patient has no known allergies.  Current Outpatient Medications on File Prior to Visit  Medication Sig Dispense Refill  . lisinopril (PRINIVIL,ZESTRIL) 5 MG tablet TAKE 1 TABLET BY MOUTH DAILY. 90 tablet 3  . rosuvastatin (CRESTOR) 10 MG tablet TAKE 1 TABLET BY MOUTH DAILY. 90 tablet 3   No current facility-administered medications on file prior to visit.     Social History   Tobacco Use  . Smoking status: Never Smoker  . Smokeless tobacco: Never Used  Substance Use Topics  . Alcohol use: Yes    Comment: occasional  . Drug use: No    Review of Systems  Constitutional: Negative for chills and fever.  HENT: Negative for congestion.   Respiratory:  Negative for cough.   Cardiovascular: Negative for chest pain, palpitations and leg swelling.  Gastrointestinal: Negative for abdominal distention, abdominal pain, diarrhea, nausea and vomiting.  Musculoskeletal: Negative for myalgias.  Skin: Negative for rash.  Neurological: Negative for headaches.  Hematological: Negative for adenopathy.  Psychiatric/Behavioral: Negative for confusion.      Objective:    BP 120/76   Pulse 73   Temp 98 F (36.7 C) (Temporal)   Ht 5\' 10"  (1.778 m)   Wt 178 lb 9.6 oz (81 kg)   SpO2 98%   BMI 25.63 kg/m   BP Readings from Last 3 Encounters:  08/20/19 120/76  09/17/18 120/74  09/02/18 116/72   Wt Readings from Last 3 Encounters:  08/20/19 178 lb 9.6 oz (81 kg)  09/17/18 171 lb (77.6 kg)  09/02/18 167 lb (75.8 kg)     Physical Exam Vitals signs reviewed.  Constitutional:      Appearance: He is well-developed.  Neck:     Thyroid: No thyroid mass or thyromegaly.  Cardiovascular:     Rate and Rhythm: Regular rhythm.     Heart sounds: Normal heart sounds.  Pulmonary:     Effort: Pulmonary effort is normal. No respiratory distress.     Breath sounds: Normal breath sounds. No wheezing, rhonchi or rales.  Lymphadenopathy:     Head:     Right side of head: No submental, submandibular, tonsillar, preauricular, posterior auricular or occipital adenopathy.     Left side of head: No submental, submandibular, tonsillar, preauricular, posterior auricular or occipital adenopathy.     Cervical: No cervical adenopathy.  Skin:    General: Skin is warm and dry.  Neurological:     Mental Status: He is alert.  Psychiatric:        Speech: Speech normal.        Behavior: Behavior normal.        Assessment & Plan:   Problem List Items Addressed This Visit      Cardiovascular and Mediastinum   HTN (hypertension)    At goal today, discussed family of heart disease.  He politely declines a new consult for risk stratification cardiology at this time.  Discussed with him cardiac scoring; he will let me know if he would like to proceed with consult.        Other   Routine general medical examination at a health care facility - Primary    Screening labs ordered. Referral for colonoscopy earlier than 50 because of mother's polyps.      Relevant Orders   Ambulatory referral to Gastroenterology   TSH   CBC with Differential/Platelet   Comprehensive metabolic panel   Hemoglobin A1c   Lipid panel   VITAMIN D 25 Hydroxy (Vit-D Deficiency, Fractures)   PSA    Other Visit Diagnoses    Lipoma of torso       Relevant Orders   Ambulatory referral to General Surgery       I am having Judeth Cornfield. Chrissie Noa "Gerald Stabs" maintain his lisinopril and rosuvastatin.   No orders of the defined types were placed in this  encounter.   Return precautions given.   Risks, benefits, and alternatives of the medications and treatment plan prescribed today were discussed, and patient expressed understanding.   Education regarding symptom management and diagnosis given to patient on AVS.   Continue to follow with Burnard Hawthorne, FNP for routine health maintenance.   [name] and I agreed with plan.   Joycelyn Schmid  Vidal Schwalbe, FNP

## 2019-08-20 NOTE — Assessment & Plan Note (Addendum)
At goal today, discussed family of heart disease.  He politely declines a new consult for risk stratification cardiology at this time.  Discussed with him cardiac scoring; he will let me know if he would like to proceed with consult.

## 2019-08-20 NOTE — Assessment & Plan Note (Signed)
Screening labs ordered. Referral for colonoscopy earlier than 50 because of mother's polyps.

## 2019-08-20 NOTE — Patient Instructions (Signed)
Let me know if ever you would like to see cardiology again for risk stratification.  Today we discussed referrals, orders. GI- colonoscopy, surgery   I have placed these orders in the system for you.  Please be sure to give [place] a call if you have not heard from our office regarding this. We should hear from [place] within ONE week with information regarding your appointment. If not, please let me know immediately.     Stay safe!   Health Maintenance, Male Adopting a healthy lifestyle and getting preventive care are important in promoting health and wellness. Ask your health care provider about:  The right schedule for you to have regular tests and exams.  Things you can do on your own to prevent diseases and keep yourself healthy. What should I know about diet, weight, and exercise? Eat a healthy diet   Eat a diet that includes plenty of vegetables, fruits, low-fat dairy products, and lean protein.  Do not eat a lot of foods that are high in solid fats, added sugars, or sodium. Maintain a healthy weight Body mass index (BMI) is a measurement that can be used to identify possible weight problems. It estimates body fat based on height and weight. Your health care provider can help determine your BMI and help you achieve or maintain a healthy weight. Get regular exercise Get regular exercise. This is one of the most important things you can do for your health. Most adults should:  Exercise for at least 150 minutes each week. The exercise should increase your heart rate and make you sweat (moderate-intensity exercise).  Do strengthening exercises at least twice a week. This is in addition to the moderate-intensity exercise.  Spend less time sitting. Even light physical activity can be beneficial. Watch cholesterol and blood lipids Have your blood tested for lipids and cholesterol at 49 years of age, then have this test every 5 years. You may need to have your cholesterol levels checked more  often if:  Your lipid or cholesterol levels are high.  You are older than 49 years of age.  You are at high risk for heart disease. What should I know about cancer screening? Many types of cancers can be detected early and may often be prevented. Depending on your health history and family history, you may need to have cancer screening at various ages. This may include screening for:  Colorectal cancer.  Prostate cancer.  Skin cancer.  Lung cancer. What should I know about heart disease, diabetes, and high blood pressure? Blood pressure and heart disease  High blood pressure causes heart disease and increases the risk of stroke. This is more likely to develop in people who have high blood pressure readings, are of African descent, or are overweight.  Talk with your health care provider about your target blood pressure readings.  Have your blood pressure checked: ? Every 3-5 years if you are 37-91 years of age. ? Every year if you are 27 years old or older.  If you are between the ages of 13 and 56 and are a current or former smoker, ask your health care provider if you should have a one-time screening for abdominal aortic aneurysm (AAA). Diabetes Have regular diabetes screenings. This checks your fasting blood sugar level. Have the screening done:  Once every three years after age 30 if you are at a normal weight and have a low risk for diabetes.  More often and at a younger age if you are overweight or  have a high risk for diabetes. What should I know about preventing infection? Hepatitis B If you have a higher risk for hepatitis B, you should be screened for this virus. Talk with your health care provider to find out if you are at risk for hepatitis B infection. Hepatitis C Blood testing is recommended for:  Everyone born from [birth year] through 1966.  Anyone with known risk factors for hepatitis C. Sexually transmitted infections (STIs)  You should be screened each year for  STIs, including gonorrhea and chlamydia, if: ? You are sexually active and are younger than 49 years of age. ? You are older than 49 years of age and your health care provider tells you that you are at risk for this type of infection. ? Your sexual activity has changed since you were last screened, and you are at increased risk for chlamydia or gonorrhea. Ask your health care provider if you are at risk.  Ask your health care provider about whether you are at high risk for HIV. Your health care provider may recommend a prescription medicine to help prevent HIV infection. If you choose to take medicine to prevent HIV, you should first get tested for HIV. You should then be tested every 3 months for as long as you are taking the medicine. Follow these instructions at home: Lifestyle  Do not use any products that contain nicotine or tobacco, such as cigarettes, e-cigarettes, and chewing tobacco. If you need help quitting, ask your health care provider.  Do not use street drugs.  Do not share needles.  Ask your health care provider for help if you need support or information about quitting drugs. Alcohol use  Do not drink alcohol if your health care provider tells you not to drink.  If you drink alcohol: ? Limit how much you have to 0-2 drinks a day. ? Be aware of how much alcohol is in your drink. In the [place] one drink equals one 12 oz bottle of beer (355 mL), one 5 oz glass of wine (148 mL), or one 1 oz glass of hard liquor (44 mL). General instructions  Schedule regular health, dental, and eye exams.  Stay current with your vaccines.  Tell your health care provider if: ? You often feel depressed. ? You have ever been abused or do not feel safe at home. Summary  Adopting a healthy lifestyle and getting preventive care are important in promoting health and wellness.  Follow your health care provider's instructions about healthy diet, exercising, and getting tested or screened for  diseases.  Follow your health care provider's instructions on monitoring your cholesterol and blood pressure. This information is not intended to replace advice given to you by your health care provider. Make sure you discuss any questions you have with your health care provider. Document Released: 06/01/2008 Document Revised: 11/27/2018 Document Reviewed: 11/27/2018 Elsevier Patient Education  2020 [employer].

## 2019-08-21 ENCOUNTER — Telehealth: Payer: Self-pay

## 2019-08-21 ENCOUNTER — Other Ambulatory Visit: Payer: Self-pay

## 2019-08-21 DIAGNOSIS — Z1211 Encounter for screening for malignant neoplasm of colon: Secondary | ICD-10-CM

## 2019-08-21 NOTE — Telephone Encounter (Signed)
Gastroenterology Pre-Procedure Review  Request Date: 09/10/19 Requesting Physician: Dr. Vicente Males  PATIENT REVIEW QUESTIONS: The patient responded to the following health history questions as indicated:    1. Are you having any GI issues? no 2. Do you have a personal history of Polyps? no 3. Do you have a family history of Colon Cancer or Polyps? yes (MOM POLYPS) 4. Diabetes Mellitus? no 5. Joint replacements in the past 12 months?no 6. Major health problems in the past 3 months?hernia surgery 09/06/19 7. Any artificial heart valves, MVP, or defibrillator?no    MEDICATIONS & ALLERGIES:    Patient reports the following regarding taking any anticoagulation/antiplatelet therapy:   Plavix, Coumadin, Eliquis, Xarelto, Lovenox, Pradaxa, Brilinta, or Effient? no Aspirin? no  Patient confirms/reports the following medications:  Current Outpatient Medications  Medication Sig Dispense Refill  . lisinopril (PRINIVIL,ZESTRIL) 5 MG tablet TAKE 1 TABLET BY MOUTH DAILY. 90 tablet 3  . rosuvastatin (CRESTOR) 10 MG tablet TAKE 1 TABLET BY MOUTH DAILY. 90 tablet 3   No current facility-administered medications for this visit.     Patient confirms/reports the following allergies:  No Known Allergies  No orders of the defined types were placed in this encounter.   AUTHORIZATION INFORMATION Primary Insurance: 1D#: Group #:  Secondary Insurance: 1D#: Group #:  SCHEDULE INFORMATION: Date: 09/10/19 Time: Location:[facility]

## 2019-08-26 ENCOUNTER — Ambulatory Visit (INDEPENDENT_AMBULATORY_CARE_PROVIDER_SITE_OTHER): Payer: No Typology Code available for payment source | Admitting: General Surgery

## 2019-08-26 ENCOUNTER — Encounter: Payer: Self-pay | Admitting: General Surgery

## 2019-08-26 ENCOUNTER — Other Ambulatory Visit: Payer: Self-pay

## 2019-08-26 VITALS — BP 129/82 | HR 73 | Temp 97.7°F | Ht 71.0 in | Wt 182.8 lb

## 2019-08-26 DIAGNOSIS — D171 Benign lipomatous neoplasm of skin and subcutaneous tissue of trunk: Secondary | ICD-10-CM | POA: Diagnosis not present

## 2019-08-26 NOTE — Progress Notes (Signed)
Patient ID: Chris Hernandez, male   DOB: 03/26/1971, 48 y.o.   MRN: [id number]  Chief Complaint  Patient presents with  . Lipoma    HPI Chris Hernandez is a 48 y.o. male.   He underwent a number of procedures about a year ago, with Dr. Bary Castilla.  These include an inguinal hernia repair, resection of a lipoma, and banding of an internal hemorrhoid.  These were all performed on 02 September 2018.  Mr. Horr states that the lipoma "never went away".  He says that he lost about 25 pounds shortly before his initial evaluation for his hernia and that is when he noticed the lipoma.  He would like to have the area checked to see if additional surgery is needed.   Past Medical History:  Diagnosis Date  . Acid reflux    not for last 7 years  . History of kidney stones   . Hyperlipidemia   . Hypertension    WEIGHT LOSS/ MED PRN  . Kidney stones 2013   kidney stones    Past Surgical History:  Procedure Laterality Date  . CYST REMOVAL PEDIATRIC     Thyroglossal duct cyst  . gerd     ENDOSCOPY  . HEMORRHOID SURGERY N/A 09/02/2018   Procedure: Thayer Jew;  Surgeon: Robert Bellow, MD;  Location: [facility] ORS;  Service: General;  Laterality: N/A;  . INGUINAL HERNIA REPAIR Right 09/02/2018   Right direct and indirect inguinal hernia repair with medium Ultra Pro mesh; Surgeon: Robert Bellow, MD;  Location: [facility] ORS;  Service: General;  Laterality: Right;  . LIPOMA EXCISION Left 09/02/2018   Excision left lower abdominal wall lipoma  . WISDOM TOOTH EXTRACTION      Family History  Problem Relation Age of Onset  . Arthritis Mother   . Hypertension Mother   . Colon polyps Mother 65  . Heart disease Father 21  . Hypertension Father   . Cancer Maternal Aunt        Breast  . Hypertension Maternal Grandmother   . Arthritis Maternal Grandmother        rheumatoid  . Cancer Maternal Aunt        Breast  . Diabetes Maternal Aunt     Social History Social History    Tobacco Use  . Smoking status: Never Smoker  . Smokeless tobacco: Never Used  Substance Use Topics  . Alcohol use: Yes    Comment: occasional  . Drug use: No    No Known Allergies  Current Outpatient Medications  Medication Sig Dispense Refill  . lisinopril (PRINIVIL,ZESTRIL) 5 MG tablet TAKE 1 TABLET BY MOUTH DAILY. 90 tablet 3  . rosuvastatin (CRESTOR) 10 MG tablet TAKE 1 TABLET BY MOUTH DAILY. 90 tablet 3   No current facility-administered medications for this visit.     Review of Systems Review of Systems  All other systems reviewed and are negative.   Blood pressure 129/82, pulse 73, temperature 97.7 F (36.5 C), height 5\' 11"  (1.803 m), weight 182 lb 12.8 oz (82.9 kg), SpO2 98 %.  Physical Exam Physical Exam Vitals signs reviewed.  Constitutional:      General: He is not in acute distress.    Appearance: Normal appearance. He is normal weight.  HENT:     Head: Normocephalic and atraumatic.     Nose:     Comments: Covered with a mask secondary to COVID-19 precautions    Mouth/Throat:     Comments: Covered with a mask  secondary to COVID-19 precautions Eyes:     General: No scleral icterus.       Right eye: No discharge.        Left eye: No discharge.     Conjunctiva/sclera: Conjunctivae normal.  Neck:     Musculoskeletal: Normal range of motion.     Comments: No thyromegaly or dominant thyroid masses appreciated Cardiovascular:     Rate and Rhythm: Normal rate and regular rhythm.     Pulses: Normal pulses.     Heart sounds: No murmur.  Pulmonary:     Effort: Pulmonary effort is normal.     Breath sounds: Normal breath sounds.  Abdominal:     General: Abdomen is flat.     Palpations: Abdomen is soft.       Comments: Scars consistent with an open inguinal hernia repair and the reported lipoma resection.  There is some fibrosis in the subcutaneous tissues deep to the scar.  No mass or lipoma is appreciated in this location.  Genitourinary:    Comments:  Deferred Musculoskeletal:        General: No swelling.     Right lower leg: No edema.     Left lower leg: No edema.  Lymphadenopathy:     Cervical: No cervical adenopathy.  Skin:    General: Skin is warm and dry.     Comments: Significant photodamage  Neurological:     General: No focal deficit present.     Mental Status: He is alert and oriented to person, place, and time.  Psychiatric:        Mood and Affect: Mood normal.        Behavior: Behavior normal.     Data Reviewed I reviewed Dr. Dwyane Luo operative report from 02 September 2018.  This clearly describes resection of a small fatty mass in the location indicated on the diagram above.  The pathology report was also reviewed and the removed tissue was consistent with a lipoma.  Assessment This is a 48 year old man who had surgery about a year ago for an inguinal hernia as well as a small lipoma.  He has been concerned that the lipoma never went away.  On my exam, however, there is no lipoma present.  There is a small amount of fibrotic scar tissue deep to the incision.  Plan Mr. Mcglothlin was greatly reassured by confirmation that the lipoma appears to have been completely resected and that there does not seem to be any recurrence.  There is no need for additional surgical intervention at this time.  We will see him back on an as-needed basis.    Fredirick Maudlin 08/26/2019, 3:24 PM

## 2019-08-26 NOTE — Patient Instructions (Signed)
   Follow-up with our office as needed.  Please call and ask to speak with a nurse if you develop questions or concerns.  

## 2019-08-28 ENCOUNTER — Telehealth: Payer: Self-pay | Admitting: Gastroenterology

## 2019-08-28 NOTE — Telephone Encounter (Signed)
Pt is calling to cancel his procedure he statets he will wait until he turns 49 years of ago so insurance will cover it

## 2019-08-29 ENCOUNTER — Ambulatory Visit (INDEPENDENT_AMBULATORY_CARE_PROVIDER_SITE_OTHER): Payer: No Typology Code available for payment source

## 2019-08-29 ENCOUNTER — Other Ambulatory Visit: Payer: Self-pay

## 2019-08-29 DIAGNOSIS — Z23 Encounter for immunization: Secondary | ICD-10-CM | POA: Diagnosis not present

## 2019-08-29 NOTE — Telephone Encounter (Signed)
Pt procedure has been cancelled. 

## 2019-09-02 ENCOUNTER — Ambulatory Visit: Payer: No Typology Code available for payment source | Admitting: General Surgery

## 2019-09-10 ENCOUNTER — Ambulatory Visit
Admission: RE | Admit: 2019-09-10 | Payer: No Typology Code available for payment source | Source: Home / Self Care | Admitting: Gastroenterology

## 2019-09-10 ENCOUNTER — Encounter: Admission: RE | Payer: Self-pay | Source: Home / Self Care

## 2019-09-10 SURGERY — COLONOSCOPY WITH PROPOFOL
Anesthesia: General

## 2019-10-29 ENCOUNTER — Other Ambulatory Visit: Payer: Self-pay

## 2019-10-29 DIAGNOSIS — Z20822 Contact with and (suspected) exposure to covid-19: Secondary | ICD-10-CM

## 2019-10-31 LAB — NOVEL CORONAVIRUS, NAA: SARS-CoV-2, NAA: NOT DETECTED

## 2019-11-03 ENCOUNTER — Encounter: Payer: Self-pay | Admitting: Family

## 2019-11-03 DIAGNOSIS — I83811 Varicose veins of right lower extremities with pain: Secondary | ICD-10-CM

## 2019-12-01 ENCOUNTER — Other Ambulatory Visit (INDEPENDENT_AMBULATORY_CARE_PROVIDER_SITE_OTHER): Payer: Self-pay | Admitting: Nurse Practitioner

## 2019-12-01 DIAGNOSIS — I83899 Varicose veins of unspecified lower extremities with other complications: Secondary | ICD-10-CM

## 2019-12-03 ENCOUNTER — Ambulatory Visit (INDEPENDENT_AMBULATORY_CARE_PROVIDER_SITE_OTHER): Payer: No Typology Code available for payment source

## 2019-12-03 ENCOUNTER — Other Ambulatory Visit: Payer: Self-pay

## 2019-12-03 ENCOUNTER — Encounter (INDEPENDENT_AMBULATORY_CARE_PROVIDER_SITE_OTHER): Payer: Self-pay | Admitting: Nurse Practitioner

## 2019-12-03 ENCOUNTER — Other Ambulatory Visit: Payer: Self-pay | Admitting: Family

## 2019-12-03 ENCOUNTER — Ambulatory Visit (INDEPENDENT_AMBULATORY_CARE_PROVIDER_SITE_OTHER): Payer: No Typology Code available for payment source | Admitting: Nurse Practitioner

## 2019-12-03 VITALS — BP 140/83 | HR 72 | Resp 16 | Wt 185.8 lb

## 2019-12-03 DIAGNOSIS — I8311 Varicose veins of right lower extremity with inflammation: Secondary | ICD-10-CM

## 2019-12-03 DIAGNOSIS — I10 Essential (primary) hypertension: Secondary | ICD-10-CM | POA: Diagnosis not present

## 2019-12-03 DIAGNOSIS — I83899 Varicose veins of unspecified lower extremities with other complications: Secondary | ICD-10-CM

## 2019-12-03 DIAGNOSIS — I8312 Varicose veins of left lower extremity with inflammation: Secondary | ICD-10-CM

## 2019-12-08 ENCOUNTER — Encounter (INDEPENDENT_AMBULATORY_CARE_PROVIDER_SITE_OTHER): Payer: Self-pay | Admitting: Nurse Practitioner

## 2019-12-08 NOTE — Progress Notes (Signed)
SUBJECTIVE:  Patient ID: Chris Hernandez, male    DOB: 1971/01/16, 48 y.o.   MRN: [id number] Chief Complaint  Patient presents with  . New Patient (Initial Visit)    ref Derrel Nip varicose veins    HPI  Chris Hernandez is a 48 y.o. male  The patient is seen for evaluation of symptomatic varicose veins. The patient relates burning and stinging which worsened steadily throughout the course of the day, particularly with standing. The patient also notes an aching and throbbing pain over the varicosities, particularly with prolonged dependent positions. The symptoms are significantly improved with elevation.  The patient also notes that during hot weather the symptoms are greatly intensified. The patient states the pain from the varicose veins interferes with work, daily exercise, shopping and household maintenance. At this point, the symptoms are persistent and severe enough that they're having a negative impact on lifestyle and are interfering with daily activities.  There is no history of DVT, PE or superficial thrombophlebitis. There is no history of ulceration or hemorrhage. The patient denies a significant family history of varicose veins.  The patient has worn graduated compression in the past. At the present time the patient has not been using over-the-counter analgesics. There is no history of prior surgical intervention or sclerotherapy.  The patient has no evidence of DVT in the right lower extremity.  There is no evidence of superficial venous thrombosis.  The patient has reflux in the right common femoral vein.  He also has reflux in the right great saphenous vein from the saphenofemoral junction to the knee.  He measures from 0.32 cm to 0.70 cm.  Past Medical History:  Diagnosis Date  . Acid reflux    not for last 7 years  . History of kidney stones   . Hyperlipidemia   . Hypertension    WEIGHT LOSS/ MED PRN  . Kidney stones 2013   kidney stones    Past Surgical  History:  Procedure Laterality Date  . CYST REMOVAL PEDIATRIC     Thyroglossal duct cyst  . gerd     ENDOSCOPY  . HEMORRHOID SURGERY N/A 09/02/2018   Procedure: Thayer Jew;  Surgeon: Robert Bellow, MD;  Location: [facility];  Service: General;  Laterality: N/A;  . INGUINAL HERNIA REPAIR Right 09/02/2018   Right direct and indirect inguinal hernia repair with medium Ultra Pro mesh; Surgeon: Robert Bellow, MD;  Location: [facility];  Service: General;  Laterality: Right;  . LIPOMA EXCISION Left 09/02/2018   Excision left lower abdominal wall lipoma  . WISDOM TOOTH EXTRACTION      Social History   Socioeconomic History  . Marital status: Married    Spouse name: misty  . Number of children: Not on file  . Years of education: Not on file  . Highest education level: Not on file  Occupational History  . Occupation: IT   Tobacco Use  . Smoking status: Never Smoker  . Smokeless tobacco: Never Used  Substance and Sexual Activity  . Alcohol use: Yes    Comment: occasional  . Drug use: No  . Sexual activity: Not on file  Other Topics Concern  . Not on file  Social History Narrative   Lives in [place] with wife and 2 kids 8YO and [place].      Work - [employer] - IT      Diet - Regular   Exercise - gym 2-3 times per week      Caffinated Beverages:yes  Herbal Remedies: no   Seat Belts:yes   Bike Helmet: yes   Exercise 3 Times a Week: no   Vegetarian: no   Eat Dairy Products:   Take Vitamins: no   Use Hearing Aid: no   Wear Dentures: no   Smoke Alarms in Home: yes   Guns/Firearms: yes   Physical Abuse: no      Hours of Sleep: 7-8   # of People in Home: 4                     Social Determinants of Health   Financial Resource Strain:   . Difficulty of Paying Living Expenses: Not on file  Food Insecurity:   . Worried About Charity fundraiser in the Last Year: Not on file  . Ran Out of Food in the Last Year: Not on file  Transportation Needs:   . Lack  of Transportation (Medical): Not on file  . Lack of Transportation (Non-Medical): Not on file  Physical Activity:   . Days of Exercise per Week: Not on file  . Minutes of Exercise per Session: Not on file  Stress:   . Feeling of Stress : Not on file  Social Connections:   . Frequency of Communication with Friends and Family: Not on file  . Frequency of Social Gatherings with Friends and Family: Not on file  . Attends Religious Services: Not on file  . Active Member of Clubs or Organizations: Not on file  . Attends Archivist Meetings: Not on file  . Marital Status: Not on file  Intimate Partner Violence:   . Fear of Current or Ex-Partner: Not on file  . Emotionally Abused: Not on file  . Physically Abused: Not on file  . Sexually Abused: Not on file    Family History  Problem Relation Age of Onset  . Arthritis Mother   . Hypertension Mother   . Colon polyps Mother 44  . Heart disease Father 47  . Hypertension Father   . Cancer Maternal Aunt        Breast  . Hypertension Maternal Grandmother   . Arthritis Maternal Grandmother        rheumatoid  . Cancer Maternal Aunt        Breast  . Diabetes Maternal Aunt     No Known Allergies   Review of Systems   Review of Systems: Negative Unless Checked Constitutional: [] Weight loss  [] Fever  [] Chills Cardiac: [] Chest pain   []  Atrial Fibrillation  [] Palpitations   [] Shortness of breath when laying flat   [] Shortness of breath with exertion. [] Shortness of breath at rest Vascular:  [] Pain in legs with walking   [x] Pain in legs with standing [] Pain in legs when laying flat   [] Claudication    [] Pain in feet when laying flat    [] History of DVT   [] Phlebitis   [] Swelling in legs   [x] Varicose veins   [] Non-healing ulcers Pulmonary:   [] Uses home oxygen   [] Productive cough   [] Hemoptysis   [] Wheeze  [] COPD   [] Asthma Neurologic:  [] Dizziness   [] Seizures  [] Blackouts [] History of stroke   [] History of TIA  [] Aphasia    [] Temporary Blindness   [] Weakness or numbness in arm   [] Weakness or numbness in leg Musculoskeletal:   [] Joint swelling   [] Joint pain   [] Low back pain  []  History of Knee Replacement [] Arthritis [] back Surgeries  []  Spinal Stenosis    Hematologic:  [] Easy  bruising  [] Easy bleeding   [] Hypercoagulable state   [] Anemic Gastrointestinal:  [] Diarrhea   [] Vomiting  [] Gastroesophageal reflux/heartburn   [] Difficulty swallowing. [] Abdominal pain Genitourinary:  [] Chronic kidney disease   [] Difficult urination  [] Anuric   [] Blood in urine [] Frequent urination  [] Burning with urination   [] Hematuria Skin:  [] Rashes   [] Ulcers [] Wounds Psychological:  [] History of anxiety   []  History of major depression  []  Memory Difficulties      OBJECTIVE:   Physical Exam  BP 140/83 (BP Location: Right Arm)   Pulse 72   Resp 16   Wt 185 lb 12.8 oz (84.3 kg)   BMI 25.91 kg/m   Gen: WD/WN, NAD Head: [place]/AT, No temporalis wasting.  Ear/Nose/Throat: Hearing grossly intact, nares w/o erythema or drainage Eyes: PER, EOMI, sclera nonicteric.  Neck: Supple, no masses.  No JVD.  Pulmonary:  Good air movement, no use of accessory muscles.  Cardiac: RRR Vascular:  Scattered varicosities on the right lower extremity, large palpable varicosities 3 mm to 7 mm.  Mild stasis dermatitis Vessel Right Left  Radial Palpable Palpable   Gastrointestinal: soft, non-distended. No guarding/no peritoneal signs.  Musculoskeletal: M/S 5/5 throughout.  No deformity or atrophy.  Neurologic: Pain and light touch intact in extremities.  Symmetrical.  Speech is fluent. Motor exam as listed above. Psychiatric: Judgment intact, Mood & affect appropriate for pt's clinical situation. Dermatologic:  No Ulcers Noted.  No changes consistent with cellulitis. Lymph : No Cervical lymphadenopathy, no lichenification or skin changes of chronic lymphedema.       ASSESSMENT AND PLAN:  1. Varicose veins of both lower extremities with  inflammation  Recommend:  The patient has large symptomatic varicose veins that are painful and associated with swelling.  I have had a long discussion with the patient regarding  varicose veins and why they cause symptoms.  Patient will begin wearing graduated compression stockings class 1 on a daily basis, beginning first thing in the morning and removing them in the evening. The patient is instructed specifically not to sleep in the stockings.    The patient  will also begin using over-the-counter analgesics such as Motrin 600 mg po TID to help control the symptoms.    In addition, behavioral modification including elevation during the day will be initiated.    Pending the results of these changes the  patient will be reevaluated in three months.     Further plans will be based on the ultrasound results and whether conservative therapies are successful at eliminating the pain and swelling.   2. Essential hypertension Continue antihypertensive medications as already ordered, these medications have been reviewed and there are no changes at this time.    Current Outpatient Medications on File Prior to Visit  Medication Sig Dispense Refill  . rosuvastatin (CRESTOR) 10 MG tablet TAKE 1 TABLET BY MOUTH DAILY. 90 tablet 3   No current facility-administered medications on file prior to visit.    There are no Patient Instructions on file for this visit. No follow-ups on file.   Kris Hartmann, NP  This note was completed with Sales executive.  Any errors are purely unintentional.

## 2020-01-12 ENCOUNTER — Encounter: Payer: Self-pay | Admitting: Family

## 2020-01-12 ENCOUNTER — Other Ambulatory Visit: Payer: Self-pay | Admitting: Family

## 2020-01-12 DIAGNOSIS — Z0000 Encounter for general adult medical examination without abnormal findings: Secondary | ICD-10-CM

## 2020-03-04 ENCOUNTER — Other Ambulatory Visit: Payer: Self-pay

## 2020-03-04 ENCOUNTER — Encounter (INDEPENDENT_AMBULATORY_CARE_PROVIDER_SITE_OTHER): Payer: Self-pay | Admitting: Nurse Practitioner

## 2020-03-04 ENCOUNTER — Ambulatory Visit (INDEPENDENT_AMBULATORY_CARE_PROVIDER_SITE_OTHER): Payer: No Typology Code available for payment source | Admitting: Nurse Practitioner

## 2020-03-04 VITALS — BP 131/87 | HR 91 | Resp 16 | Wt 183.0 lb

## 2020-03-04 DIAGNOSIS — I8312 Varicose veins of left lower extremity with inflammation: Secondary | ICD-10-CM

## 2020-03-04 DIAGNOSIS — I8311 Varicose veins of right lower extremity with inflammation: Secondary | ICD-10-CM | POA: Diagnosis not present

## 2020-03-04 DIAGNOSIS — I10 Essential (primary) hypertension: Secondary | ICD-10-CM

## 2020-03-08 ENCOUNTER — Encounter (INDEPENDENT_AMBULATORY_CARE_PROVIDER_SITE_OTHER): Payer: Self-pay | Admitting: Nurse Practitioner

## 2020-03-08 NOTE — Progress Notes (Signed)
SUBJECTIVE:  Patient ID: Chris Hernandez, male    DOB: August 14, 1971, 49 y.o.   MRN: [id number] Chief Complaint  Patient presents with  . Follow-up    32month follow up    HPI  Chris Hernandez is a 49 y.o. male The patient returns for followup evaluation 3 months after the initial visit. The patient continues to have pain in the lower extremities with dependency. The pain is lessened with elevation. Graduated compression stockings, Class I (20-30 mmHg), have been worn but the stockings do not eliminate the leg pain. Over-the-counter analgesics do not improve the symptoms. The degree of discomfort continues to interfere with daily activities. The patient notes the pain in the legs is causing problems with daily exercise, at the workplace and even with household activities and maintenance such as standing in the kitchen preparing meals and doing dishes.   The patient has no evidence of DVT in the right lower extremity.  There is no evidence of superficial venous thrombosis.  The patient has reflux in the right common femoral vein.  He also has reflux in the right great saphenous vein from the saphenofemoral junction to the knee.  He measures from 0.32 cm to 0.70 cm.  Past Medical History:  Diagnosis Date  . Acid reflux    not for last 7 years  . History of kidney stones   . Hyperlipidemia   . Hypertension    WEIGHT LOSS/ MED PRN  . Kidney stones 2013   kidney stones    Past Surgical History:  Procedure Laterality Date  . CYST REMOVAL PEDIATRIC     Thyroglossal duct cyst  . gerd     ENDOSCOPY  . HEMORRHOID SURGERY N/A 09/02/2018   Procedure: Thayer Jew;  Surgeon: Robert Bellow, MD;  Location: [facility];  Service: General;  Laterality: N/A;  . INGUINAL HERNIA REPAIR Right 09/02/2018   Right direct and indirect inguinal hernia repair with medium Ultra Pro mesh; Surgeon: Robert Bellow, MD;  Location: [facility];  Service: General;  Laterality: Right;  . LIPOMA  EXCISION Left 09/02/2018   Excision left lower abdominal wall lipoma  . WISDOM TOOTH EXTRACTION      Social History   Socioeconomic History  . Marital status: Married    Spouse name: misty  . Number of children: Not on file  . Years of education: Not on file  . Highest education level: Not on file  Occupational History  . Occupation: IT   Tobacco Use  . Smoking status: Never Smoker  . Smokeless tobacco: Never Used  Substance and Sexual Activity  . Alcohol use: Yes    Comment: occasional  . Drug use: No  . Sexual activity: Not on file  Other Topics Concern  . Not on file  Social History Narrative   Lives in [place] with wife and 2 kids 8YO and [place].      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 Difficulty of Paying Living Expenses:   Food Insecurity:   . Worried About Charity fundraiser in the Last Year:   . Arboriculturist in the Last Year:   Transportation Needs:   . Film/video editor (Medical):   [name] Lack of Transportation (Non-Medical):   Physical Activity:   . Days of Exercise per Week:   . Minutes of Exercise per Session:   Stress:   . Feeling of Stress :   Social Connections:   . Frequency of Communication with Friends and Family:   . Frequency of Social Gatherings with Friends and Family:   . Attends Religious Services:   . Active Member of Clubs or Organizations:   . Attends Archivist Meetings:   [name] Marital Status:   Intimate Partner Violence:   . Fear of Current or  Ex-Partner:   . Emotionally Abused:   [name] Physically Abused:   . Sexually Abused:     Family History  Problem Relation Age of Onset  . Arthritis Mother   . Hypertension Mother   . Colon polyps Mother 41  . Heart disease Father 37  . Hypertension Father   . Cancer Maternal Aunt        Breast  . Hypertension Maternal Grandmother   . Arthritis Maternal Grandmother        rheumatoid  . Cancer Maternal Aunt        Breast  . Diabetes Maternal Aunt     No Known Allergies   Review of Systems   Review of Systems: Negative Unless Checked Constitutional: [] Weight loss  [] Fever  [] Chills Cardiac: [] Chest pain   []  Atrial Fibrillation  [] Palpitations   [] Shortness of breath when laying flat   [] Shortness of breath with exertion. [] Shortness of breath at rest Vascular:  [] Pain in legs with walking   [x] Pain in legs with standing [] Pain in legs when laying flat   [] Claudication    [] Pain in feet when laying flat    [] History of DVT   [] Phlebitis   [] Swelling in legs   [x] Varicose veins   [] Non-healing ulcers Pulmonary:   [] Uses home oxygen   [] Productive cough   [] Hemoptysis   [] Wheeze  [] COPD   [] Asthma Neurologic:  [] Dizziness   [] Seizures  [] Blackouts [] History of stroke   [] History of TIA  [] Aphasia   [] Temporary Blindness   [] Weakness or numbness in arm   [] Weakness or numbness in leg Musculoskeletal:   [] Joint swelling   [] Joint pain   [] Low back pain  []  History of Knee Replacement [] Arthritis [] back Surgeries  []  Spinal Stenosis    Hematologic:  [] Easy bruising  [] Easy bleeding   [] Hypercoagulable state   [] Anemic Gastrointestinal:  [] Diarrhea   [] Vomiting  [] Gastroesophageal reflux/heartburn   [] Difficulty swallowing. [] Abdominal pain Genitourinary:  [] Chronic kidney disease   [] Difficult urination  [] Anuric   [] Blood in urine [] Frequent urination  [] Burning with urination   [] Hematuria Skin:  [] Rashes   [] Ulcers [] Wounds Psychological:  [] History of anxiety   []  History of major  depression  []  Memory Difficulties      OBJECTIVE:   Physical Exam  BP 131/87 (BP Location: Left Arm)   Pulse 91   Resp 16   Wt 183 lb (83 kg)   BMI  25.52 kg/m   Gen: WD/WN, NAD Head: [place]/AT, No temporalis wasting.  Ear/Nose/Throat: Hearing grossly intact, nares w/o erythema or drainage Eyes: PER, EOMI, sclera nonicteric.  Neck: Supple, no masses.  No JVD.  Pulmonary:  Good air movement, no use of accessory muscles.  Cardiac: RRR Vascular: Scattered varicosities on the right lower extremity, large palpable varicosities 3 mm to 7 mm.  Mild stasis dermatitis Vessel Right Left  Radial Palpable Palpable   Gastrointestinal: soft, non-distended. No guarding/no peritoneal signs.  Musculoskeletal: M/S 5/5 throughout.  No deformity or atrophy.  Neurologic: Pain and light touch intact in extremities.  Symmetrical.  Speech is fluent. Motor exam as listed above. Psychiatric: Judgment intact, Mood & affect appropriate for pt's clinical situation. Dermatologic: No Venous rashes. No Ulcers Noted.  No changes consistent with cellulitis. Lymph : No Cervical lymphadenopathy, no lichenification or skin changes of chronic lymphedema.       ASSESSMENT AND PLAN:  1. Varicose veins of both lower extremities with inflammation Recommend  I have reviewed my previous  discussion with the patient regarding  varicose veins and why they cause symptoms. Patient will continue  wearing graduated compression stockings class 1 on a daily basis, beginning first thing in the morning and removing them in the evening.    In addition, behavioral modification including elevation during the day was again discussed and this will continue.  The patient has utilized over the counter pain medications and has been exercising.  However, at this time conservative therapy has not alleviated the patient's symptoms of leg pain and swelling  Recommend: laser ablation of the right great saphenous veins to eliminate the  symptoms of pain and swelling of the lower extremities caused by the severe superficial venous reflux disease.   2. Essential hypertension Continue antihypertensive medications as already ordered, these medications have been reviewed and there are no changes at this time.    Current Outpatient Medications on File Prior to Visit  Medication Sig Dispense Refill  . lisinopril (ZESTRIL) 5 MG tablet TAKE 1 TABLET BY MOUTH DAILY. 90 tablet 3  . rosuvastatin (CRESTOR) 10 MG tablet TAKE 1 TABLET BY MOUTH DAILY. 90 tablet 3   No current facility-administered medications on file prior to visit.    There are no Patient Instructions on file for this visit. No follow-ups on file.   Kris Hartmann, NP  This note was completed with Sales executive.  Any errors are purely unintentional.

## 2020-04-08 ENCOUNTER — Encounter (INDEPENDENT_AMBULATORY_CARE_PROVIDER_SITE_OTHER): Payer: Self-pay | Admitting: Vascular Surgery

## 2020-04-08 ENCOUNTER — Other Ambulatory Visit: Payer: Self-pay

## 2020-04-08 ENCOUNTER — Ambulatory Visit (INDEPENDENT_AMBULATORY_CARE_PROVIDER_SITE_OTHER): Payer: No Typology Code available for payment source | Admitting: Vascular Surgery

## 2020-04-08 DIAGNOSIS — I83819 Varicose veins of unspecified lower extremities with pain: Secondary | ICD-10-CM | POA: Insufficient documentation

## 2020-04-08 DIAGNOSIS — I83811 Varicose veins of right lower extremities with pain: Secondary | ICD-10-CM

## 2020-04-08 NOTE — Progress Notes (Signed)
    MRN : [id number]  Chris Hernandez is a 49 y.o. (August 29, 1971) male who presents with chief complaint of painful varicose veins.    The patient's right lower extremity was sterilely prepped and draped.  The ultrasound machine was used to visualize the right great saphenous vein throughout its course.  A segment of the GSV below the knee was selected for access.  The saphenous vein was accessed without difficulty using ultrasound guidance with a micropuncture needle.   An 0.018  wire was placed beyond the saphenofemoral junction through the sheath and the microneedle was removed.  The 65 cm sheath was then placed over the wire and the wire and dilator were removed.  The laser fiber was placed through the sheath and its tip was placed approximately 2 cm below the saphenofemoral junction.  Tumescent anesthesia was then created with a dilute lidocaine solution.  Laser energy was then delivered with constant withdrawal of the sheath and laser fiber.  Approximately 2080 Joules of energy were delivered over a length of 44 cm.  Sterile dressings were placed.  The patient tolerated the procedure well without complications.

## 2020-04-09 ENCOUNTER — Other Ambulatory Visit (INDEPENDENT_AMBULATORY_CARE_PROVIDER_SITE_OTHER): Payer: Self-pay | Admitting: Vascular Surgery

## 2020-04-09 DIAGNOSIS — Z9889 Other specified postprocedural states: Secondary | ICD-10-CM

## 2020-04-12 ENCOUNTER — Other Ambulatory Visit: Payer: Self-pay

## 2020-04-12 ENCOUNTER — Ambulatory Visit (INDEPENDENT_AMBULATORY_CARE_PROVIDER_SITE_OTHER): Payer: No Typology Code available for payment source

## 2020-04-12 DIAGNOSIS — Z9889 Other specified postprocedural states: Secondary | ICD-10-CM | POA: Diagnosis not present

## 2020-05-13 ENCOUNTER — Other Ambulatory Visit: Payer: Self-pay | Admitting: Family

## 2020-05-13 DIAGNOSIS — E785 Hyperlipidemia, unspecified: Secondary | ICD-10-CM

## 2020-07-08 ENCOUNTER — Encounter: Payer: No Typology Code available for payment source | Admitting: Dermatology

## 2020-10-25 ENCOUNTER — Ambulatory Visit (INDEPENDENT_AMBULATORY_CARE_PROVIDER_SITE_OTHER): Payer: No Typology Code available for payment source | Admitting: Dermatology

## 2020-10-25 ENCOUNTER — Other Ambulatory Visit: Payer: Self-pay

## 2020-10-25 DIAGNOSIS — Z1283 Encounter for screening for malignant neoplasm of skin: Secondary | ICD-10-CM

## 2020-10-25 DIAGNOSIS — L821 Other seborrheic keratosis: Secondary | ICD-10-CM

## 2020-10-25 DIAGNOSIS — L578 Other skin changes due to chronic exposure to nonionizing radiation: Secondary | ICD-10-CM

## 2020-10-25 DIAGNOSIS — L814 Other melanin hyperpigmentation: Secondary | ICD-10-CM

## 2020-10-25 DIAGNOSIS — D229 Melanocytic nevi, unspecified: Secondary | ICD-10-CM

## 2020-10-25 DIAGNOSIS — D1800 Hemangioma unspecified site: Secondary | ICD-10-CM

## 2020-10-25 DIAGNOSIS — Z86018 Personal history of other benign neoplasm: Secondary | ICD-10-CM

## 2020-10-25 NOTE — Progress Notes (Signed)
   Follow-Up Visit   Subjective  Chris Hernandez is a 49 y.o. male who presents for the following: Annual Exam (History of dysplastic nevi - TBSE today). The patient presents for Total-Body Skin Exam (TBSE) for skin cancer screening and mole check.  The following portions of the chart were reviewed this encounter and updated as appropriate:  Tobacco  Allergies  Meds  Problems  Med Hx  Surg Hx  Fam Hx     Review of Systems:  No other skin or systemic complaints except as noted in HPI or Assessment and Plan.  Objective  Well appearing patient in no apparent distress; mood and affect are within normal limits.  A full examination was performed including scalp, head, eyes, ears, nose, lips, neck, chest, axillae, abdomen, back, buttocks, bilateral upper extremities, bilateral lower extremities, hands, feet, fingers, toes, fingernails, and toenails. All findings within normal limits unless otherwise noted below.  Objective  Abdomen: Brown macules of abdomen, back and chest.  Photos today  Images                 Assessment & Plan    History of Dysplastic Nevi - No evidence of recurrence today - Recommend regular full body skin exams - Recommend daily broad spectrum sunscreen SPF 30+ to sun-exposed areas, reapply every 2 hours as needed.  - Call if any new or changing lesions are noted between office visits  Lentigines - Scattered tan macules - Discussed due to sun exposure - Benign, observe - Call for any changes  Seborrheic Keratoses - Stuck-on, waxy, tan-brown papules and plaques  - Discussed benign etiology and prognosis. - Observe - Call for any changes  Melanocytic Nevi - many slightly irregular - see photos - Tan-brown and/or pink-flesh-colored symmetric macules and papules - Benign appearing on exam today - Observation - Call clinic for new or changing moles - Recommend daily use of broad spectrum spf 30+ sunscreen to sun-exposed areas.    Hemangiomas - Red papules - Discussed benign nature - Observe - Call for any changes  Actinic Damage - Chronic, secondary to cumulative UV/sun exposure - diffuse scaly erythematous macules with underlying dyspigmentation - Recommend daily broad spectrum sunscreen SPF 30+ to sun-exposed areas, reapply every 2 hours as needed.  - Call for new or changing lesions.  Skin cancer screening performed today  Return in about 6 months (around 04/24/2021).   I, Ashok Cordia, CMA, am acting as scribe for Sarina Ser, MD .  Documentation: I have reviewed the above documentation for accuracy and completeness, and I agree with the above.  Sarina Ser, MD

## 2020-10-26 ENCOUNTER — Encounter: Payer: Self-pay | Admitting: Dermatology

## 2020-12-02 ENCOUNTER — Other Ambulatory Visit: Payer: Self-pay | Admitting: Family

## 2020-12-02 DIAGNOSIS — I10 Essential (primary) hypertension: Secondary | ICD-10-CM

## 2021-03-18 ENCOUNTER — Encounter: Payer: Self-pay | Admitting: Family

## 2021-03-18 ENCOUNTER — Ambulatory Visit (INDEPENDENT_AMBULATORY_CARE_PROVIDER_SITE_OTHER): Payer: No Typology Code available for payment source | Admitting: Family

## 2021-03-18 ENCOUNTER — Other Ambulatory Visit: Payer: Self-pay

## 2021-03-18 VITALS — BP 110/70 | HR 86 | Temp 97.8°F | Ht 70.9 in | Wt 179.8 lb

## 2021-03-18 DIAGNOSIS — Z0000 Encounter for general adult medical examination without abnormal findings: Secondary | ICD-10-CM | POA: Diagnosis not present

## 2021-03-18 DIAGNOSIS — I10 Essential (primary) hypertension: Secondary | ICD-10-CM | POA: Diagnosis not present

## 2021-03-18 DIAGNOSIS — E785 Hyperlipidemia, unspecified: Secondary | ICD-10-CM | POA: Diagnosis not present

## 2021-03-18 DIAGNOSIS — Z23 Encounter for immunization: Secondary | ICD-10-CM

## 2021-03-18 DIAGNOSIS — Z125 Encounter for screening for malignant neoplasm of prostate: Secondary | ICD-10-CM

## 2021-03-18 LAB — CBC WITH DIFFERENTIAL/PLATELET
Basophils Absolute: 0 10*3/uL (ref 0.0–0.1)
Basophils Relative: 0.9 % (ref 0.0–3.0)
Eosinophils Absolute: 0.1 10*3/uL (ref 0.0–0.7)
Eosinophils Relative: 2.3 % (ref 0.0–5.0)
HCT: 44.4 % (ref 39.0–52.0)
Hemoglobin: 15.4 g/dL (ref 13.0–17.0)
Lymphocytes Relative: 38.2 % (ref 12.0–46.0)
Lymphs Abs: 1.6 10*3/uL (ref 0.7–4.0)
MCHC: 34.7 g/dL (ref 30.0–36.0)
MCV: 86.1 fl (ref 78.0–100.0)
Monocytes Absolute: 0.4 10*3/uL (ref 0.1–1.0)
Monocytes Relative: 10 % (ref 3.0–12.0)
Neutro Abs: 2.1 10*3/uL (ref 1.4–7.7)
Neutrophils Relative %: 48.6 % (ref 43.0–77.0)
Platelets: 157 10*3/uL (ref 150.0–400.0)
RBC: 5.15 Mil/uL (ref 4.22–5.81)
RDW: 14.2 % (ref 11.5–15.5)
WBC: 4.3 10*3/uL (ref 4.0–10.5)

## 2021-03-18 LAB — COMPREHENSIVE METABOLIC PANEL
ALT: 12 U/L (ref 0–53)
AST: 14 U/L (ref 0–37)
Albumin: 4.8 g/dL (ref 3.5–5.2)
Alkaline Phosphatase: 43 U/L (ref 39–117)
BUN: 19 mg/dL (ref 6–23)
CO2: 28 mEq/L (ref 19–32)
Calcium: 9.5 mg/dL (ref 8.4–10.5)
Chloride: 105 mEq/L (ref 96–112)
Creatinine, Ser: 0.94 mg/dL (ref 0.40–1.50)
GFR: 94.43 mL/min (ref >60.00)
Glucose, Bld: 101 mg/dL — ABNORMAL HIGH (ref 70–99)
Potassium: 4.7 mEq/L (ref 3.5–5.1)
Sodium: 140 mEq/L (ref 135–145)
Total Bilirubin: 0.6 mg/dL (ref 0.2–1.2)
Total Protein: 7.1 g/dL (ref 6.0–8.3)

## 2021-03-18 LAB — VITAMIN D 25 HYDROXY (VIT D DEFICIENCY, FRACTURES): VITD: 18.15 ng/mL — ABNORMAL LOW (ref 30.00–100.00)

## 2021-03-18 LAB — PSA: PSA: 0.58 ng/mL (ref 0.10–4.00)

## 2021-03-18 LAB — TSH: TSH: 2.59 u[IU]/mL (ref 0.35–4.50)

## 2021-03-18 LAB — LIPID PANEL
Cholesterol: 198 mg/dL (ref 0–200)
HDL: 39.7 mg/dL (ref >39.00)
LDL Cholesterol: 142 mg/dL — ABNORMAL HIGH (ref 0–99)
NonHDL: 158.13
Total CHOL/HDL Ratio: 5
Triglycerides: 83 mg/dL (ref 0.0–149.0)
VLDL: 16.6 mg/dL (ref 0.0–40.0)

## 2021-03-18 LAB — HEMOGLOBIN A1C: Hgb A1c MFr Bld: 5 % (ref 4.6–6.5)

## 2021-03-18 NOTE — Assessment & Plan Note (Signed)
Colonoscopy ordered. Encouraged continued healthy diet, exercise as he is doing. Tdap given.

## 2021-03-18 NOTE — Assessment & Plan Note (Signed)
Controlled. Continue crestor 10mg 

## 2021-03-18 NOTE — Addendum Note (Signed)
Addended by: Cheri Rous E on: 03/18/2021 11:54 AM   Modules accepted: Orders

## 2021-03-18 NOTE — Patient Instructions (Signed)
Referral for colonoscopy Let us know if you dont hear back within a week in regards to an appointment being scheduled.   Nice to see you!   Health Maintenance, Male Adopting a healthy lifestyle and getting preventive care are important in promoting health and wellness. Ask your health care provider about:  The right schedule for you to have regular tests and exams.  Things you can do on your own to prevent diseases and keep yourself healthy. What should I know about diet, weight, and exercise? Eat a healthy diet  Eat a diet that includes plenty of vegetables, fruits, low-fat dairy products, and lean protein.  Do not eat a lot of foods that are high in solid fats, added sugars, or sodium.   Maintain a healthy weight Body mass index (BMI) is a measurement that can be used to identify possible weight problems. It estimates body fat based on height and weight. Your health care provider can help determine your BMI and help you achieve or maintain a healthy weight. Get regular exercise Get regular exercise. This is one of the most important things you can do for your health. Most adults should:  Exercise for at least 150 minutes each week. The exercise should increase your heart rate and make you sweat (moderate-intensity exercise).  Do strengthening exercises at least twice a week. This is in addition to the moderate-intensity exercise.  Spend less time sitting. Even light physical activity can be beneficial. Watch cholesterol and blood lipids Have your blood tested for lipids and cholesterol at 51 years of age, then have this test every 5 years. You may need to have your cholesterol levels checked more often if:  Your lipid or cholesterol levels are high.  You are older than 51 years of age.  You are at high risk for heart disease. What should I know about cancer screening? Many types of cancers can be detected early and may often be prevented. Depending on your health history and  family history, you may need to have cancer screening at various ages. This may include screening for:  Colorectal cancer.  Prostate cancer.  Skin cancer.  Lung cancer. What should I know about heart disease, diabetes, and high blood pressure? Blood pressure and heart disease  High blood pressure causes heart disease and increases the risk of stroke. This is more likely to develop in people who have high blood pressure readings, are of African descent, or are overweight.  Talk with your health care provider about your target blood pressure readings.  Have your blood pressure checked: ? Every 3-5 years if you are 40-16 years of age. ? Every year if you are 71 years old or older.  If you are between the ages of 66 and 55 and are a current or former smoker, ask your health care provider if you should have a one-time screening for abdominal aortic aneurysm (AAA). Diabetes Have regular diabetes screenings. This checks your fasting blood sugar level. Have the screening done:  Once every three years after age 66 if you are at a normal weight and have a low risk for diabetes.  More often and at a younger age if you are overweight or have a high risk for diabetes. What should I know about preventing infection? Hepatitis B If you have a higher risk for hepatitis B, you should be screened for this virus. Talk with your health care provider to find out if you are at risk for hepatitis B infection. Hepatitis C Blood testing  is recommended for:  Everyone born from [birth year] through 1966.  Anyone with known risk factors for hepatitis C. Sexually transmitted infections (STIs)  You should be screened each year for STIs, including gonorrhea and chlamydia, if: ? You are sexually active and are younger than 51 years of age. ? You are older than 51 years of age and your health care provider tells you that you are at risk for this type of infection. ? Your sexual activity has changed since you were  last screened, and you are at increased risk for chlamydia or gonorrhea. Ask your health care provider if you are at risk.  Ask your health care provider about whether you are at high risk for HIV. Your health care provider may recommend a prescription medicine to help prevent HIV infection. If you choose to take medicine to prevent HIV, you should first get tested for HIV. You should then be tested every 3 months for as long as you are taking the medicine. Follow these instructions at home: Lifestyle  Do not use any products that contain nicotine or tobacco, such as cigarettes, e-cigarettes, and chewing tobacco. If you need help quitting, ask your health care provider.  Do not use street drugs.  Do not share needles.  Ask your health care provider for help if you need support or information about quitting drugs. Alcohol use  Do not drink alcohol if your health care provider tells you not to drink.  If you drink alcohol: ? Limit how much you have to 0-2 drinks a day. ? Be aware of how much alcohol is in your drink. In the [place] one drink equals one 12 oz bottle of beer (355 mL), one 5 oz glass of wine (148 mL), or one 1 oz glass of hard liquor (44 mL). General instructions  Schedule regular health, dental, and eye exams.  Stay current with your vaccines.  Tell your health care provider if: ? You often feel depressed. ? You have ever been abused or do not feel safe at home. Summary  Adopting a healthy lifestyle and getting preventive care are important in promoting health and wellness.  Follow your health care provider's instructions about healthy diet, exercising, and getting tested or screened for diseases.  Follow your health care provider's instructions on monitoring your cholesterol and blood pressure. This information is not intended to replace advice given to you by your health care provider. Make sure you discuss any questions you have with your health care  provider. Document Revised: 11/27/2018 Document Reviewed: 11/27/2018 Elsevier Patient Education  2021 [employer].

## 2021-03-18 NOTE — Progress Notes (Signed)
Subjective:    Patient ID: Chris Hernandez, male    DOB: July 09, 1971, 50 y.o.   MRN: [id number]  CC: Chris Hernandez is a 50 y.o. male who presents today for physical exam.    HPI:  Feels well today. No complaints.   No depression.   Following low carb diet and exercise program through [employer].  HTN- compliant with lisinopril 5mg . No cp  HLD - compliant with crestor 10mg .    Follows annually dermatology, Dr Nehemiah Massed.   Colorectal  Cancer Screening: due Prostate Cancer Screening: due. No trouble urinating, decreased urine stream.   Lung Cancer Screening: No 30 year pack year history and > 50 years to 80 years.   Immunizations       Tetanus - due         Hepatitis C screening - Candidate for, consents  Labs: Screening labs today. Exercise: Gets regular exercise.   Alcohol use:  occassional Smoking/tobacco use: Nonsmoker.     HISTORY:  Past Medical History:  Diagnosis Date  . Acid reflux    not for last 7 years  . Dysplastic nevus 10/08/2008   right lower back post waistline  . Dysplastic nevus 11/18/2008   left lower back spinal, left paraspinal low back  . Dysplastic nevus 09/04/2018   left paraspinal mid to upper back, right mid back 5.0 cm lat to spine  . Dysplastic nevus 06/11/2019   left ant lat side  . Dysplastic nevus 11/11/2019   right lat side above waistline, right low back 5.0 cm lat to spine, right infrapectoral, right ant axillary  . Dysplastic nevus 01/14/2020   right post waistline 4.0 cm lat to spine, left low back 4.0 cm lat to spine, left low back sup 2.5 cm lat to spine, right mid side, right side lat abdomen  . History of kidney stones   . Hyperlipidemia   . Hypertension    WEIGHT LOSS/ MED PRN  . Kidney stones 2013   kidney stones    Past Surgical History:  Procedure Laterality Date  . CYST REMOVAL PEDIATRIC     Thyroglossal duct cyst  . gerd     ENDOSCOPY  . HEMORRHOID SURGERY N/A 09/02/2018   Procedure: Thayer Jew;  Surgeon: Robert Bellow, MD;  Location: [facility] ORS;  Service: General;  Laterality: N/A;  . INGUINAL HERNIA REPAIR Right 09/02/2018   Right direct and indirect inguinal hernia repair with medium Ultra Pro mesh; Surgeon: Robert Bellow, MD;  Location: [facility] ORS;  Service: General;  Laterality: Right;  . LIPOMA EXCISION Left 09/02/2018   Excision left lower abdominal wall lipoma  . WISDOM TOOTH EXTRACTION     Family History  Problem Relation Age of Onset  . Arthritis Mother   . Hypertension Mother   . Colon polyps Mother 92  . Heart disease Father 39  . Hypertension Father   . Cancer Maternal Aunt        Breast  . Hypertension Maternal Grandmother   . Arthritis Maternal Grandmother        rheumatoid  . Cancer Maternal Aunt        Breast  . Diabetes Maternal Aunt       ALLERGIES: Patient has no known allergies.  Current Outpatient Medications on File Prior to Visit  Medication Sig Dispense Refill  . lisinopril (ZESTRIL) 5 MG tablet TAKE 1 TABLET BY MOUTH DAILY. 90 tablet 3  . rosuvastatin (CRESTOR) 10 MG tablet TAKE 1 TABLET BY MOUTH  DAILY. 90 tablet 3   No current facility-administered medications on file prior to visit.    Social History   Tobacco Use  . Smoking status: Never Smoker  . Smokeless tobacco: Never Used  Vaping Use  . Vaping Use: Never used  Substance Use Topics  . Alcohol use: Yes    Comment: occasional  . Drug use: No    Review of Systems  Constitutional: Negative for chills and fever.  Respiratory: Negative for cough.   Cardiovascular: Negative for chest pain and palpitations.  Gastrointestinal: Negative for nausea and vomiting.  Genitourinary: Negative for decreased urine volume and difficulty urinating.      Objective:    BP 110/70   Pulse 86   Temp 97.8 F (36.6 C)   Ht 5' 10.9" (1.801 m)   Wt 179 lb 12.8 oz (81.6 kg)   SpO2 98%   BMI 25.15 kg/m   BP Readings from Last 3 Encounters:  03/18/21 110/70  04/08/20 120/75   03/04/20 131/87   Wt Readings from Last 3 Encounters:  03/18/21 179 lb 12.8 oz (81.6 kg)  04/08/20 183 lb (83 kg)  03/04/20 183 lb (83 kg)    Physical Exam Vitals reviewed.  Constitutional:      Appearance: He is well-developed.  Neck:     Thyroid: No thyroid mass or thyromegaly.  Cardiovascular:     Rate and Rhythm: Regular rhythm.     Heart sounds: Normal heart sounds.  Pulmonary:     Effort: Pulmonary effort is normal. No respiratory distress.     Breath sounds: Normal breath sounds. No wheezing, rhonchi or rales.  Lymphadenopathy:     Head:     Right side of head: No submental, submandibular, tonsillar, preauricular, posterior auricular or occipital adenopathy.     Left side of head: No submental, submandibular, tonsillar, preauricular, posterior auricular or occipital adenopathy.     Cervical: No cervical adenopathy.  Skin:    General: Skin is warm and dry.  Neurological:     Mental Status: He is alert.  Psychiatric:        Speech: Speech normal.        Behavior: Behavior normal.        Assessment & Plan:   Problem List Items Addressed This Visit      Cardiovascular and Mediastinum   HTN (hypertension)    Controlled. Continue lisinopril 5mg         Other   Hyperlipidemia    Controlled. Continue crestor 10mg       Routine general medical examination at a health care facility - Primary    Colonoscopy ordered. Encouraged continued healthy diet, exercise as he is doing. Tdap given.       Relevant Orders   TSH   CBC with Differential/Platelet   Comprehensive metabolic panel   Hemoglobin A1c   Hepatitis C antibody   Lipid panel   PSA   VITAMIN D 25 Hydroxy (Vit-D Deficiency, Fractures)   Ambulatory referral to Gastroenterology       I have discontinued Judeth Cornfield. Oki "Chris"'s ALPRAZolam. I am also having him maintain his rosuvastatin and lisinopril.   No orders of the defined types were placed in this encounter.   Return precautions  given.   Risks, benefits, and alternatives of the medications and treatment plan prescribed today were discussed, and patient expressed understanding.   Education regarding symptom management and diagnosis given to patient on AVS.   Continue to follow with Vidal Schwalbe Yvetta Coder, FNP for  routine health maintenance.   [name] and I agreed with plan.   [name], FNP

## 2021-03-18 NOTE — Assessment & Plan Note (Signed)
Controlled. Continue lisinopril 5mg 

## 2021-03-21 LAB — HEPATITIS C ANTIBODY
Hepatitis C Ab: NONREACTIVE
SIGNAL TO CUT-OFF: 0.02 (ref <1.00)

## 2021-03-22 ENCOUNTER — Other Ambulatory Visit (HOSPITAL_COMMUNITY): Payer: Self-pay

## 2021-03-31 ENCOUNTER — Other Ambulatory Visit: Payer: Self-pay

## 2021-03-31 ENCOUNTER — Encounter: Payer: Self-pay | Admitting: Family

## 2021-03-31 ENCOUNTER — Telehealth: Payer: Self-pay

## 2021-03-31 DIAGNOSIS — Z1211 Encounter for screening for malignant neoplasm of colon: Secondary | ICD-10-CM

## 2021-03-31 MED ORDER — NA SULFATE-K SULFATE-MG SULF 17.5-3.13-1.6 GM/177ML PO SOLN
1.0000 | Freq: Once | ORAL | 0 refills | Status: AC
  Filled 2021-03-31 – 2021-04-18 (×2): qty 354, 1d supply, fill #0

## 2021-03-31 NOTE — Telephone Encounter (Signed)
Gastroenterology Pre-Procedure Review  Request Date: 05/09/21 Requesting Physician: Dr. Vicente Males  PATIENT REVIEW QUESTIONS: The patient responded to the following health history questions as indicated:    1. Are you having any GI issues? no 2. Do you have a personal history of Polyps? yes (Mother colon polyps) 3. Do you have a family history of Colon Cancer or Polyps? no 4. Diabetes Mellitus? no 5. Joint replacements in the past 12 months?no 6. Major health problems in the past 3 months?no 7. Any artificial heart valves, MVP, or defibrillator?no    MEDICATIONS & ALLERGIES:    Patient reports the following regarding taking any anticoagulation/antiplatelet therapy:   Plavix, Coumadin, Eliquis, Xarelto, Lovenox, Pradaxa, Brilinta, or Effient? no Aspirin? no  Patient confirms/reports the following medications:  Current Outpatient Medications  Medication Sig Dispense Refill  . lisinopril (ZESTRIL) 5 MG tablet TAKE 1 TABLET BY MOUTH DAILY. 90 tablet 3  . rosuvastatin (CRESTOR) 10 MG tablet TAKE 1 TABLET BY MOUTH DAILY. 90 tablet 3   No current facility-administered medications for this visit.    Patient confirms/reports the following allergies:  No Known Allergies  No orders of the defined types were placed in this encounter.   AUTHORIZATION INFORMATION Primary Insurance: 1D#: Group #:  Secondary Insurance: 1D#: Group #:  SCHEDULE INFORMATION: Date: Monday 05/09/21 Time: Location:[facility]

## 2021-04-11 ENCOUNTER — Telehealth: Payer: Self-pay

## 2021-04-11 NOTE — Telephone Encounter (Signed)
Please call [phone]. CRM is needed from [facility] in order to continue authorization process for patient.

## 2021-04-12 ENCOUNTER — Other Ambulatory Visit: Payer: Self-pay

## 2021-04-17 ENCOUNTER — Encounter: Payer: Self-pay | Admitting: Family

## 2021-04-18 ENCOUNTER — Other Ambulatory Visit: Payer: Self-pay

## 2021-04-18 NOTE — Telephone Encounter (Signed)
Called and spoke with Patient, he states that he has an appointment to be tested through [facility]. Patient states he will send a message with his results.   Informed the Patient to be seen by urgent care if symptoms persist or worsen. Patient declined appointment with [place] or going to urgent care today.

## 2021-04-18 NOTE — Telephone Encounter (Signed)
Does Patient need an appointment?

## 2021-04-28 ENCOUNTER — Other Ambulatory Visit: Payer: Self-pay

## 2021-04-28 ENCOUNTER — Encounter: Payer: Self-pay | Admitting: Dermatology

## 2021-04-28 ENCOUNTER — Ambulatory Visit: Payer: No Typology Code available for payment source | Admitting: Dermatology

## 2021-04-28 DIAGNOSIS — D1800 Hemangioma unspecified site: Secondary | ICD-10-CM

## 2021-04-28 DIAGNOSIS — L578 Other skin changes due to chronic exposure to nonionizing radiation: Secondary | ICD-10-CM | POA: Diagnosis not present

## 2021-04-28 DIAGNOSIS — Z86018 Personal history of other benign neoplasm: Secondary | ICD-10-CM | POA: Diagnosis not present

## 2021-04-28 DIAGNOSIS — L720 Epidermal cyst: Secondary | ICD-10-CM

## 2021-04-28 DIAGNOSIS — Z1283 Encounter for screening for malignant neoplasm of skin: Secondary | ICD-10-CM | POA: Diagnosis not present

## 2021-04-28 DIAGNOSIS — L814 Other melanin hyperpigmentation: Secondary | ICD-10-CM

## 2021-04-28 DIAGNOSIS — L821 Other seborrheic keratosis: Secondary | ICD-10-CM

## 2021-04-28 DIAGNOSIS — D229 Melanocytic nevi, unspecified: Secondary | ICD-10-CM

## 2021-04-28 NOTE — Patient Instructions (Addendum)
If you have any questions or concerns for your doctor, please call our main line at [phone] and press option 4 to reach your doctor's medical assistant. If no one answers, please leave a voicemail as directed and we will return your call as soon as possible. Messages left after 4 pm will be answered the following business day.   You may also send [place] a message via [place]. We typically respond to MyChart messages within 1-2 business days.  For prescription refills, please ask your pharmacy to contact our office. Our fax number is [phone].  If you have an urgent issue when the clinic is closed that cannot wait until the next business day, you can page your doctor at the number below.    Please note that while we do our best to be available for urgent issues outside of office hours, we are not available 24/7.   If you have an urgent issue and are unable to reach [place], you may choose to seek medical care at your doctor's office, retail clinic, urgent care center, or emergency room.  If you have a medical emergency, please immediately call 911 or go to the emergency department.  Pager Numbers  - Dr. Nehemiah Massed: [phone]  - Dr. Laurence Ferrari: [phone]  - Dr. Nicole Kindred: [phone]  In the event of inclement weather, please call our main line at [phone] for an update on the status of any delays or closures.  Dermatology Medication Tips: Please keep the boxes that topical medications come in in order to help keep track of the instructions about where and how to use these. Pharmacies typically print the medication instructions only on the boxes and not directly on the medication tubes.   If your medication is too expensive, please contact our office at [phone] option 4 or send [place] a message through [place].   We are unable to tell what your co-pay for medications will be in advance as this is different depending on your insurance coverage. However, we may be able to find a substitute  medication at lower cost or fill out paperwork to get insurance to cover a needed medication.   If a prior authorization is required to get your medication covered by your insurance company, please allow [place] 1-2 business days to complete this process.  Drug prices often vary depending on where the prescription is filled and some pharmacies may offer cheaper prices.  The website www.goodrx.com contains coupons for medications through different pharmacies. The prices here do not account for what the cost may be with help from insurance (it may be cheaper with your insurance), but the website can give you the price if you did not use any insurance.    Pre-Operative Instructions  You are scheduled for a surgical procedure at [facility]. We recommend you read the following instructions. If you have any questions or concerns, please call the office at [phone].  1. Shower and wash the entire body with soap and water the day of your surgery paying special attention to cleansing at and around the planned surgery site.  2. Avoid aspirin or aspirin containing products at least fourteen (14) days prior to your surgical procedure and for at least one week (7 Days) after your surgical procedure. If you take aspirin on a regular basis for heart disease or history of stroke or for any other reason, we may recommend you continue taking aspirin but please notify us if you take this on a regular basis. Aspirin can cause more bleeding  to occur during surgery as well as prolonged bleeding and bruising after surgery.   3. Avoid other nonsteroidal pain medications at least one week prior to surgery and at least one week prior to your surgery. These include medications such as Ibuprofen (Motrin, Advil and Nuprin), Naprosyn, Voltaren, Relafen, etc. If medications are used for therapeutic reasons, please inform us as they can cause increased bleeding or prolonged bleeding during and bruising after surgical  procedures.   4. Please advise [place] if you are taking any "blood thinner" medications such as Coumadin or Dipyridamole or Plavix or similar medications. These cause increased bleeding and prolonged bleeding during procedures and bruising after surgical procedures. We may have to consider discontinuing these medications briefly prior to and shortly after your surgery if safe to do so.   5. Please inform us of all medications you are currently taking. All medications that are taken regularly should be taken the day of surgery as you always do. Nevertheless, we need to be informed of what medications you are taking prior to surgery to know whether they will affect the procedure or cause any complications.   6. Please inform us of any medication allergies. Also inform us of whether you have allergies to Latex or rubber products or whether you have had any adverse reaction to Lidocaine or Epinephrine.  7. Please inform us of any prosthetic or artificial body parts such as artificial heart valve, joint replacements, etc., or similar condition that might require preoperative antibiotics.   8. We recommend avoidance of alcohol at least two weeks prior to surgery and continued avoidance for at least two weeks after surgery.   9. We recommend discontinuation of tobacco smoking at least two weeks prior to surgery and continued abstinence for at least two weeks after surgery.  10. Do not plan strenuous exercise, strenuous work or strenuous lifting for approximately four weeks after your surgery.   11. We request if you are unable to make your scheduled surgical appointment, please call us at least a week in advance or as soon as you are aware of a problem so that we can cancel or reschedule the appointment.   12. You MAY TAKE TYLENOL (acetaminophen) for pain as it is not a blood thinner.   PLEASE PLAN TO BE IN TOWN FOR TWO WEEKS FOLLOWING SURGERY, THIS IS IMPORTANT SO YOU CAN BE CHECKED FOR DRESSING CHANGES,  SUTURE REMOVAL AND TO MONITOR FOR POSSIBLE COMPLICATIONS. - You can print the associated coupon and take it with your prescription to the pharmacy.  - You may also stop by our office during regular business hours and pick up a GoodRx coupon card.  - If you need your prescription sent electronically to a different pharmacy, notify our office through [facility] or by phone at [phone] option 4.

## 2021-04-28 NOTE — Progress Notes (Signed)
   Follow-Up Visit   Subjective  Chris Hernandez is a 50 y.o. male who presents for the following: Annual Exam (Hx dysplastic nevi - numerous nevi ). The patient presents for Total-Body Skin Exam (TBSE) for skin cancer screening and mole check.  The following portions of the chart were reviewed this encounter and updated as appropriate:   Tobacco  Allergies  Meds  Problems  Med Hx  Surg Hx  Fam Hx     Review of Systems:  No other skin or systemic complaints except as noted in HPI or Assessment and Plan.  Objective  Well appearing patient in no apparent distress; mood and affect are within normal limits.  A full examination was performed including scalp, head, eyes, ears, nose, lips, neck, chest, axillae, abdomen, back, buttocks, bilateral upper extremities, bilateral lower extremities, hands, feet, fingers, toes, fingernails, and toenails. All findings within normal limits unless otherwise noted below.  Objective  L mid back paraspinal: 1.0 cm firm SQ nodule.  Assessment & Plan  Epidermal inclusion cyst L mid back paraspinal Benign-appearing.  Observation.  Call clinic for new or changing lesions.  Recommend daily use of broad spectrum spf 30+ sunscreen to sun-exposed areas.  Discussed surgical excision if bothersome.   Skin cancer screening   Lentigines - Scattered tan macules - Due to sun exposure - Benign-appering, observe - Recommend daily broad spectrum sunscreen SPF 30+ to sun-exposed areas, reapply every 2 hours as needed. - Call for any changes  Seborrheic Keratoses - Stuck-on, waxy, tan-brown papules and/or plaques  - Benign-appearing - Discussed benign etiology and prognosis. - Observe - Call for any changes  Melanocytic Nevi - stable compared to previous photos - Tan-brown and/or pink-flesh-colored symmetric macules and papules - Benign appearing on exam today - Observation - Call clinic for new or changing moles - Recommend daily use of broad spectrum  spf 30+ sunscreen to sun-exposed areas.   Hemangiomas - Red papules - Discussed benign nature - Observe - Call for any changes  Actinic Damage - Chronic condition, secondary to cumulative UV/sun exposure - diffuse scaly erythematous macules with underlying dyspigmentation - Recommend daily broad spectrum sunscreen SPF 30+ to sun-exposed areas, reapply every 2 hours as needed.  - Staying in the shade or wearing long sleeves, sun glasses (UVA+UVB protection) and wide brim hats (4-inch brim around the entire circumference of the hat) are also recommended for sun protection.  - Call for new or changing lesions.  History of Dysplastic Nevi - numerous, see history  - No evidence of recurrence today - Recommend regular full body skin exams - Recommend daily broad spectrum sunscreen SPF 30+ to sun-exposed areas, reapply every 2 hours as needed.  - Call if any new or changing lesions are noted between office visits  Skin cancer screening performed today.  Return for 6-12 mths for TBSE; to be scheduled for excision of cyst .  I, Rudell Cobb, CMA, am acting as scribe for Sarina Ser, MD .  Documentation: I have reviewed the above documentation for accuracy and completeness, and I agree with the above.  Sarina Ser, MD

## 2021-05-02 ENCOUNTER — Other Ambulatory Visit: Payer: Self-pay | Admitting: Family

## 2021-05-02 MED ORDER — ROSUVASTATIN CALCIUM 10 MG PO TABS
ORAL_TABLET | Freq: Every day | ORAL | 3 refills | Status: DC
  Filled 2021-05-13: qty 90, 90d supply, fill #0
  Filled 2021-08-16: qty 90, 90d supply, fill #1
  Filled 2021-11-17: qty 90, 90d supply, fill #2
  Filled 2022-02-06: qty 90, 90d supply, fill #3

## 2021-05-03 ENCOUNTER — Encounter: Payer: Self-pay | Admitting: Dermatology

## 2021-05-04 ENCOUNTER — Telehealth: Payer: Self-pay | Admitting: Gastroenterology

## 2021-05-04 ENCOUNTER — Other Ambulatory Visit: Payer: Self-pay

## 2021-05-04 DIAGNOSIS — Z1211 Encounter for screening for malignant neoplasm of colon: Secondary | ICD-10-CM

## 2021-05-04 MED ORDER — NA SULFATE-K SULFATE-MG SULF 17.5-3.13-1.6 GM/177ML PO SOLN
1.0000 | Freq: Once | ORAL | 0 refills | Status: AC
  Filled 2021-05-04: qty 354, 1d supply, fill #0

## 2021-05-04 NOTE — Telephone Encounter (Signed)
Patient needs to reschedule procedure for anytime after June 13.  Please call to reschedule.

## 2021-05-04 NOTE — Telephone Encounter (Signed)
Sent my chart message to patient with available dates to reschedule procedure.

## 2021-05-13 ENCOUNTER — Other Ambulatory Visit: Payer: Self-pay

## 2021-06-01 ENCOUNTER — Ambulatory Visit: Payer: No Typology Code available for payment source | Admitting: Certified Registered"

## 2021-06-01 ENCOUNTER — Other Ambulatory Visit: Payer: Self-pay

## 2021-06-01 ENCOUNTER — Encounter
Admission: RE | Disposition: A | Discharge status: Home / Self Care | Payer: Self-pay | Source: Home / Self Care | Attending: Gastroenterology

## 2021-06-01 ENCOUNTER — Ambulatory Visit
Admission: RE | Admit: 2021-06-01 | Discharge: 2021-06-01 | Disposition: A | Discharge status: Home / Self Care | Payer: No Typology Code available for payment source | Attending: Gastroenterology | Admitting: Gastroenterology

## 2021-06-01 ENCOUNTER — Encounter: Payer: Self-pay | Admitting: Gastroenterology

## 2021-06-01 DIAGNOSIS — Z79899 Other long term (current) drug therapy: Secondary | ICD-10-CM | POA: Diagnosis not present

## 2021-06-01 DIAGNOSIS — K640 First degree hemorrhoids: Secondary | ICD-10-CM | POA: Insufficient documentation

## 2021-06-01 DIAGNOSIS — Z8371 Family history of colonic polyps: Secondary | ICD-10-CM | POA: Diagnosis not present

## 2021-06-01 DIAGNOSIS — Z1211 Encounter for screening for malignant neoplasm of colon: Secondary | ICD-10-CM | POA: Insufficient documentation

## 2021-06-01 DIAGNOSIS — K5730 Diverticulosis of large intestine without perforation or abscess without bleeding: Secondary | ICD-10-CM | POA: Diagnosis not present

## 2021-06-01 HISTORY — PX: COLONOSCOPY WITH PROPOFOL: SHX5780

## 2021-06-01 SURGERY — COLONOSCOPY WITH PROPOFOL
Anesthesia: General

## 2021-06-01 MED ORDER — SODIUM CHLORIDE 0.9 % IV SOLN: INTRAVENOUS | Status: DC

## 2021-06-01 MED ORDER — MIDAZOLAM HCL 5 MG/5ML IJ SOLN
INTRAMUSCULAR | Status: DC | PRN
  Administered 2021-06-01: 2 mg via INTRAVENOUS

## 2021-06-01 MED ORDER — LIDOCAINE 2% (20 MG/ML) 5 ML SYRINGE
INTRAMUSCULAR | Status: DC | PRN
  Administered 2021-06-01: 25 mg via INTRAVENOUS

## 2021-06-01 MED ORDER — PROPOFOL 10 MG/ML IV BOLUS
INTRAVENOUS | Status: DC | PRN
  Administered 2021-06-01: 70 mg via INTRAVENOUS
  Administered 2021-06-01 (×2): 30 mg via INTRAVENOUS

## 2021-06-01 MED ORDER — PROPOFOL 500 MG/50ML IV EMUL
INTRAVENOUS | Status: DC | PRN
  Administered 2021-06-01: 120 ug/kg/min via INTRAVENOUS

## 2021-06-01 MED ORDER — MIDAZOLAM HCL 2 MG/2ML IJ SOLN
INTRAMUSCULAR | Status: AC
  Filled 2021-06-01: qty 2

## 2021-06-01 NOTE — H&P (Signed)
Chris Bellows, MD [address] [place], [place], [place], [id number], [place], [place], [place], [address] Phone: [phone]  Fax: [phone]  Primary Care Physician:  Chris Hawthorne, FNP   Pre-Procedure History & Physical: HPI:  Chris Hernandez is a 50 y.o. male is here for an colonoscopy.   Past Medical History:  Diagnosis Date   Acid reflux    not for last 7 years   Dysplastic nevus 10/08/2008   right lower back post waistline   Dysplastic nevus 11/18/2008   left lower back spinal, left paraspinal low back   Dysplastic nevus 09/04/2018   left paraspinal mid to upper back, right mid back 5.0 cm lat to spine   Dysplastic nevus 06/11/2019   left ant lat side   Dysplastic nevus 11/11/2019   right lat side above waistline, right low back 5.0 cm lat to spine, right infrapectoral, right ant axillary   Dysplastic nevus 01/14/2020   right post waistline 4.0 cm lat to spine, left low back 4.0 cm lat to spine, left low back sup 2.5 cm lat to spine, right mid side, right side lat abdomen   History of kidney stones    Hyperlipidemia    Hypertension    WEIGHT LOSS/ MED PRN   Kidney stones 2013   kidney stones    Past Surgical History:  Procedure Laterality Date   CYST REMOVAL PEDIATRIC     Thyroglossal duct cyst   gerd     ENDOSCOPY   HEMORRHOID SURGERY N/A 09/02/2018   Procedure: Thayer Jew;  Surgeon: Chris Bellow, MD;  Location: [facility] ORS;  Service: General;  Laterality: N/A;   INGUINAL HERNIA REPAIR Right 09/02/2018   Right direct and indirect inguinal hernia repair with medium Ultra Pro mesh; Surgeon: Chris Bellow, MD;  Location: [facility] ORS;  Service: General;  Laterality: Right;   LIPOMA EXCISION Left 09/02/2018   Excision left lower abdominal wall lipoma   WISDOM TOOTH EXTRACTION      Prior to Admission medications   Medication Sig Start Date End Date Taking? Authorizing Provider  lisinopril (ZESTRIL) 5 MG tablet TAKE 1  TABLET BY MOUTH DAILY. 12/02/20  Yes Arnett, Yvetta Coder, FNP  rosuvastatin (CRESTOR) 10 MG tablet TAKE 1 TABLET BY MOUTH DAILY. 05/13/20  Yes Arnett, Yvetta Coder, FNP  rosuvastatin (CRESTOR) 10 MG tablet TAKE 1 TABLET BY MOUTH DAILY. 05/02/21 05/02/22 Yes Arnett, Yvetta Coder, FNP  lisinopril (ZESTRIL) 5 MG tablet TAKE 1 TABLET BY MOUTH DAILY. Patient not taking: Reported on 04/28/2021 12/02/20 12/02/21  Chris Hawthorne, FNP    Allergies as of 03/31/2021   (No Known Allergies)    Family History  Problem Relation Age of Onset   Arthritis Mother    Hypertension Mother    Colon polyps Mother 33   Heart disease Father 38   Hypertension Father    Cancer Maternal Aunt        Breast   Hypertension Maternal Grandmother    Arthritis Maternal Grandmother        rheumatoid   Cancer Maternal Aunt        Breast   Diabetes Maternal Aunt     Social History   Socioeconomic History   Marital status: Married    Spouse name: misty   Number of children: Not on file   Years of education: Not on file   Highest education level: Not on file  Occupational History   Occupation: IT  Tobacco Use   Smoking status: Never   Smokeless tobacco: Never  Vaping Use   Vaping Use: Never used  Substance and Sexual Activity   Alcohol use: Yes    Comment: occasional   Drug use: No   Sexual activity: Not on file  Other Topics Concern   Not on file  Social History Narrative   Lives in [place] with wife and 2 kids 8YO and 3YO.      Work - [employer] - IT      Diet - Regular   Exercise - gym 2-3 times per week      Caffinated Beverages:yes   Herbal Remedies: no   Seat Belts:yes   Bike Helmet: yes   Exercise 3 Times a Week: no   Vegetarian: no   Eat Dairy Products:   Take Vitamins: no   Use Hearing Aid: no   Wear Dentures: no   Smoke Alarms in Home: yes   Guns/Firearms: yes   Physical Abuse: no      Hours of Sleep: 7-8   # of People in Home: 4                     Social Determinants  of Health   Financial Resource Strain: Not on file  Food Insecurity: Not on file  Transportation Needs: Not on file  Physical Activity: Not on file  Stress: Not on file  Social Connections: Not on file  Intimate Partner Violence: Not on file    Review of Systems: See HPI, otherwise negative ROS  Physical Exam: BP 120/86   Pulse 85   Temp (!) 97 F (36.1 C) (Temporal)   Resp 20   Ht 5\' 11"  (1.803 m)   Wt 75.8 kg   SpO2 99%   BMI 23.29 kg/m  General:   Alert,  pleasant and cooperative in NAD Head:  Normocephalic and atraumatic. Neck:  Supple; no masses or thyromegaly. Lungs:  Clear throughout to auscultation, normal respiratory effort.    Heart:  +S1, +S2, Regular rate and rhythm, No edema. Abdomen:  Soft, nontender and nondistended. Normal bowel sounds, without guarding, and without rebound.   Neurologic:  Alert and  oriented x4;  grossly normal neurologically.  Impression/Plan: Chris Hernandez is here for an colonoscopy to be performed for Screening colonoscopy mother has history of colon polyps Risks, benefits, limitations, and alternatives regarding  colonoscopy have been reviewed with the patient.  Questions have been answered.  All parties agreeable.   Chris Bellows, MD  06/01/2021, 1:48 PM

## 2021-06-01 NOTE — Op Note (Signed)
[facility] Gastroenterology Patient Name: Chris Hernandez Procedure Date: 06/01/2021 1:45 PM MRN: [id number] Account #: [id number] Date of Birth: 11/05/1971 Admit Type: Outpatient Age: 50 Room: [facility] ENDO ROOM 3 Gender: Male Note Status: Finalized Procedure:             Colonoscopy Indications:           Colon cancer screening in patient at increased risk:                         Family history of 1st-degree relative with colon polyps Providers:             Jonathon Bellows MD, MD Referring MD:          Yvetta Coder. Arnett (Referring MD) Medicines:             Monitored Anesthesia Care Complications:         No immediate complications. Procedure:             Pre-Anesthesia Assessment:                        - Prior to the procedure, a History and Physical was                         performed, and patient medications, allergies and                         sensitivities were reviewed. The patient's tolerance                         of previous anesthesia was reviewed.                        - The risks and benefits of the procedure and the                         sedation options and risks were discussed with the                         patient. All questions were answered and informed                         consent was obtained.                        - ASA Grade Assessment: II - A patient with mild                         systemic disease.                        After obtaining informed consent, the colonoscope was                         passed under direct vision. Throughout the procedure,                         the patient's blood pressure, pulse, and oxygen                         saturations  were monitored continuously. The                         Colonoscope was introduced through the anus and                         advanced to the the cecum, identified by the                         appendiceal orifice. The colonoscopy was performed                          with ease. The patient tolerated the procedure well.                         The quality of the bowel preparation was good. Findings:      The perianal and digital rectal examinations were normal.      Multiple small-mouthed diverticula were found in the sigmoid colon.      Non-bleeding internal hemorrhoids were found during retroflexion. The       hemorrhoids were large and Grade I (internal hemorrhoids that do not       prolapse).      The exam was otherwise without abnormality on direct and retroflexion       views. Impression:            - Diverticulosis in the sigmoid colon.                        - Non-bleeding internal hemorrhoids.                        - The examination was otherwise normal on direct and                         retroflexion views.                        - No specimens collected. Recommendation:        - Discharge patient to home (with escort).                        - Resume previous diet.                        - Continue present medications.                        - Repeat colonoscopy in 5 years for screening purposes. Procedure Code(s):     --- Professional ---                        [phone], Colonoscopy, flexible; diagnostic, including                         collection of specimen(s) by brushing or washing, when                         performed (separate procedure) Diagnosis Code(s):     --- Professional ---  Z83.71, Family history of colonic polyps                        K64.0, First degree hemorrhoids                        K57.30, Diverticulosis of large intestine without                         perforation or abscess without bleeding CPT copyright 2019 American Medical Association. All rights reserved. The codes documented in this report are preliminary and upon coder review may  be revised to meet current compliance requirements. Jonathon Bellows, MD Jonathon Bellows MD, MD 06/01/2021 2:12:22 PM This report has been signed  electronically. Number of Addenda: 0 Note Initiated On: 06/01/2021 1:45 PM Scope Withdrawal Time: 0 hours 7 minutes 38 seconds  Total Procedure Duration: 0 hours 11 minutes 18 seconds  Estimated Blood Loss:  Estimated blood loss: none.      [facility]

## 2021-06-01 NOTE — Anesthesia Postprocedure Evaluation (Signed)
Anesthesia Post Note  Patient: Chris Hernandez  Procedure(s) Performed: COLONOSCOPY WITH PROPOFOL  Patient location during evaluation: Phase II Anesthesia Type: General Level of consciousness: awake and alert, awake and oriented Pain management: pain level controlled Vital Signs Assessment: post-procedure vital signs reviewed and stable Respiratory status: spontaneous breathing, nonlabored ventilation and respiratory function stable Cardiovascular status: blood pressure returned to baseline and stable Postop Assessment: no apparent nausea or vomiting Anesthetic complications: no   No notable events documented.   Last Vitals:  Vitals:   06/01/21 1432 06/01/21 1439  BP: 105/71 118/72  Pulse: 65 63  Resp: 15 18  Temp:    SpO2: 97% 98%    Last Pain:  Vitals:   06/01/21 1439  TempSrc:   PainSc: 0-No pain                 Phill Mutter

## 2021-06-01 NOTE — Transfer of Care (Signed)
Immediate Anesthesia Transfer of Care Note  Patient: Chris Hernandez  Procedure(s) Performed: COLONOSCOPY WITH PROPOFOL  Patient Location: Endoscopy Unit  Anesthesia Type:General  Level of Consciousness: drowsy  Airway & Oxygen Therapy: Patient Spontanous Breathing  Post-op Assessment: Report given to RN and Post -op Vital signs reviewed and stable  Post vital signs: Reviewed  Last Vitals:  Vitals Value Taken Time  BP    Temp    Pulse 78 06/01/21 1413  Resp 15 06/01/21 1413  SpO2 96 % 06/01/21 1413  Vitals shown include unvalidated device data.  Last Pain:  Vitals:   06/01/21 1312  TempSrc: Temporal  PainSc: 0-No pain         Complications: No notable events documented.

## 2021-06-01 NOTE — Anesthesia Preprocedure Evaluation (Signed)
Anesthesia Evaluation  Patient identified by MRN, date of birth, ID band Patient awake    Reviewed: Allergy & Precautions, H&P , NPO status , Patient's Chart, lab work & pertinent test results  History of Anesthesia Complications Negative for: history of anesthetic complications  Airway Mallampati: II  TM Distance: >3 FB Neck ROM: full    Dental  (+) Chipped   Pulmonary neg pulmonary ROS, neg shortness of breath,           Cardiovascular Exercise Tolerance: Good hypertension, Pt. on medications (-) angina(-) Past MI and (-) DOE      Neuro/Psych negative neurological ROS  negative psych ROS   GI/Hepatic Neg liver ROS, GERD  ,  Endo/Other  negative endocrine ROS  Renal/GU Renal disease     Musculoskeletal   Abdominal   Peds  Hematology negative hematology ROS (+)   Anesthesia Other Findings Past Medical History: No date: Acid reflux     Comment:  not for last 7 years No date: History of kidney stones No date: Hyperlipidemia No date: Hypertension     Comment:  WEIGHT LOSS/ MED PRN 2013: Kidney stones     Comment:  kidney stones  Past Surgical History: No date: CYST REMOVAL PEDIATRIC     Comment:  Throat No date: gerd     Comment:  ENDOSCOPY No date: WISDOM TOOTH EXTRACTION  BMI    Body Mass Index:  22.97 kg/m      Reproductive/Obstetrics negative OB ROS                             Anesthesia Physical  Anesthesia Plan  ASA: 2  Anesthesia Plan: General LMA and General   Post-op Pain Management:    Induction: Intravenous  PONV Risk Score and Plan: 2 and Propofol infusion and TIVA  Airway Management Planned: LMA, Natural Airway and Nasal Cannula  Additional Equipment:   Intra-op Plan:   Post-operative Plan: Extubation in OR  Informed Consent: I have reviewed the patients History and Physical, chart, labs and discussed the procedure including the risks, benefits  and alternatives for the proposed anesthesia with the patient or authorized representative who has indicated his/her understanding and acceptance.     Dental Advisory Given  Plan Discussed with: Anesthesiologist, CRNA and Surgeon  Anesthesia Plan Comments: (Patient consented for risks of anesthesia including but not limited to:  - adverse reactions to medications - damage to teeth, lips or other oral mucosa - sore throat or hoarseness - Damage to heart, brain, lungs or loss of life  Patient voiced understanding.)        Anesthesia Quick Evaluation

## 2021-06-02 ENCOUNTER — Encounter: Payer: Self-pay | Admitting: Gastroenterology

## 2021-06-05 ENCOUNTER — Other Ambulatory Visit: Payer: Self-pay

## 2021-06-05 MED FILL — Lisinopril Tab 5 MG: ORAL | 90 days supply | Qty: 90 | Fill #0 | Status: AC

## 2021-06-06 ENCOUNTER — Other Ambulatory Visit: Payer: Self-pay

## 2021-06-07 ENCOUNTER — Other Ambulatory Visit: Payer: Self-pay

## 2021-06-07 ENCOUNTER — Ambulatory Visit (INDEPENDENT_AMBULATORY_CARE_PROVIDER_SITE_OTHER): Payer: No Typology Code available for payment source | Admitting: Dermatology

## 2021-06-07 DIAGNOSIS — L720 Epidermal cyst: Secondary | ICD-10-CM

## 2021-06-07 DIAGNOSIS — D485 Neoplasm of uncertain behavior of skin: Secondary | ICD-10-CM

## 2021-06-07 MED ORDER — MUPIROCIN 2 % EX OINT
1.0000 | TOPICAL_OINTMENT | Freq: Every day | CUTANEOUS | 0 refills | Status: DC
Start: 2021-06-07 — End: 2022-08-28
  Filled 2021-06-07: qty 22, 7d supply, fill #0

## 2021-06-07 NOTE — Patient Instructions (Signed)
Wound Care Instructions  Cleanse wound gently with soap and water once a day then pat dry with clean gauze. Apply a thing coat of Petrolatum (petroleum jelly, "Vaseline") over the wound (unless you have an allergy to this). We recommend that you use a new, sterile tube of Vaseline. Do not pick or remove scabs. Do not remove the yellow or white "healing tissue" from the base of the wound.  Cover the wound with fresh, clean, nonstick gauze and secure with paper tape. You may use Band-Aids in place of gauze and tape if the would is small enough, but would recommend trimming much of the tape off as there is often too much. Sometimes Band-Aids can irritate the skin.  You should call the office for your biopsy report after 1 week if you have not already been contacted.  If you experience any problems, such as abnormal amounts of bleeding, swelling, significant bruising, significant pain, or evidence of infection, please call the office immediately.  FOR ADULT SURGERY PATIENTS: If you need something for pain relief you may take 1 extra strength Tylenol (acetaminophen) AND 2 Ibuprofen (200mg each) together every 4 hours as needed for pain. (do not take these if you are allergic to them or if you have a reason you should not take them.) Typically, you may only need pain medication for 1 to 3 days.   If you have any questions or concerns for your doctor, please call our main line at [phone] and press option 4 to reach your doctor's medical assistant. If no one answers, please leave a voicemail as directed and we will return your call as soon as possible. Messages left after 4 pm will be answered the following business day.   You may also send us a message via MyChart. We typically respond to MyChart messages within 1-2 business days.  For prescription refills, please ask your pharmacy to contact our office. Our fax number is [phone].  If you have an urgent issue when the clinic is closed that  cannot wait until the next business day, you can page your doctor at the number below.    Please note that while we do our best to be available for urgent issues outside of office hours, we are not available 24/7.   If you have an urgent issue and are unable to reach us, you may choose to seek medical care at your doctor's office, retail clinic, urgent care center, or emergency room.  If you have a medical emergency, please immediately call 911 or go to the emergency department.  Pager Numbers  - Dr. Kowalski: [phone]  - Dr. Moye: [phone]  - Dr. Stewart: [phone]  In the event of inclement weather, please call our main line at [phone] for an update on the status of any delays or closures.  Dermatology Medication Tips: Please keep the boxes that topical medications come in in order to help keep track of the instructions about where and how to use these. Pharmacies typically print the medication instructions only on the boxes and not directly on the medication tubes.   If your medication is too expensive, please contact our office at [phone] option 4 or send us a message through MyChart.   We are unable to tell what your co-pay for medications will be in advance as this is different depending on your insurance coverage. However, we may be able to find a substitute medication at lower cost or fill out paperwork to get insurance to cover a needed   medication.   If a prior authorization is required to get your medication covered by your insurance company, please allow us 1-2 business days to complete this process.  Drug prices often vary depending on where the prescription is filled and some pharmacies may offer cheaper prices.  The website www.goodrx.com contains coupons for medications through different pharmacies. The prices here do not account for what the cost may be with help from insurance (it may be cheaper with your insurance), but the website can give you the  price if you did not use any insurance.  - You can print the associated coupon and take it with your prescription to the pharmacy.  - You may also stop by our office during regular business hours and pick up a GoodRx coupon card.  - If you need your prescription sent electronically to a different pharmacy, notify our office through [facility] MyChart or by phone at [phone] option 4.   

## 2021-06-07 NOTE — Progress Notes (Signed)
   Follow-Up Visit   Subjective  Chris Hernandez is a 50 y.o. male who presents for the following: Cyst (Left mid back paraspinal - excise today).  The following portions of the chart were reviewed this encounter and updated as appropriate:   Tobacco  Allergies  Meds  Problems  Med Hx  Surg Hx  Fam Hx      Review of Systems:  No other skin or systemic complaints except as noted in HPI or Assessment and Plan.  Objective  Well appearing patient in no apparent distress; mood and affect are within normal limits.  A focused examination was performed including back. Relevant physical exam findings are noted in the Assessment and Plan.  Left mid back paraspinal 1.2 cm cystic papule   Assessment & Plan  Neoplasm of uncertain behavior of skin Left mid back paraspinal  Skin excision  Lesion length (cm):  1.2 Lesion width (cm):  1.2 Margin per side (cm):  0 Total excision diameter (cm):  1.2 Informed consent: discussed and consent obtained   Timeout: patient name, date of birth, surgical site, and procedure verified   Procedure prep:  Patient was prepped and draped in usual sterile fashion Prep type:  Isopropyl alcohol and povidone-iodine Anesthesia: the lesion was anesthetized in a standard fashion   Anesthetic:  1% lidocaine w/ epinephrine 1-100,000 buffered w/ 8.4% NaHCO3 Instrument used: #15 blade   Hemostasis achieved with: pressure   Hemostasis achieved with comment:  Electrocautery Outcome: patient tolerated procedure well with no complications   Post-procedure details: sterile dressing applied and wound care instructions given   Dressing type: bandage and pressure dressing (mupirocin)    Skin repair Complexity:  Complex Final length (cm):  2 Reason for type of repair: reduce tension to allow closure, reduce the risk of dehiscence, infection, and necrosis, reduce subcutaneous dead space and avoid a hematoma, allow closure of the large defect, preserve normal anatomy,  preserve normal anatomical and functional relationships and enhance both functionality and cosmetic results   Undermining: area extensively undermined   Undermining comment:  Undermining defect 1.2 cm Subcutaneous layers (deep stitches):  Suture size:  2-0 Suture type: Vicryl (polyglactin 910)   Subcutaneous suture technique: inverted dermal. Fine/surface layer approximation (top stitches):  Suture size:  3-0 Suture type: nylon   Stitches: simple running   Suture removal (days):  7 Hemostasis achieved with: suture and pressure Outcome: patient tolerated procedure well with no complications   Post-procedure details: sterile dressing applied and wound care instructions given   Dressing type: bandage and pressure dressing (mupirocin)    mupirocin ointment (BACTROBAN) 2 % Apply 1 application topically daily. With dressing changes  Specimen 1 - Surgical pathology Differential Diagnosis: Cyst vs other Check Margins: No  Return in about 1 week (around 06/14/2021) for suture removal.  I, Ashok Cordia, CMA, am acting as scribe for Sarina Ser, MD . Documentation: I have reviewed the above documentation for accuracy and completeness, and I agree with the above.  Sarina Ser, MD

## 2021-06-08 ENCOUNTER — Telehealth: Payer: Self-pay

## 2021-06-08 NOTE — Telephone Encounter (Signed)
Spoke with patient regarding surgery. He is doing fine/hd  

## 2021-06-10 ENCOUNTER — Encounter: Payer: Self-pay | Admitting: Dermatology

## 2021-06-14 ENCOUNTER — Other Ambulatory Visit: Payer: Self-pay

## 2021-06-14 ENCOUNTER — Ambulatory Visit: Payer: No Typology Code available for payment source | Admitting: Dermatology

## 2021-06-14 DIAGNOSIS — Z4802 Encounter for removal of sutures: Secondary | ICD-10-CM

## 2021-06-14 DIAGNOSIS — L720 Epidermal cyst: Secondary | ICD-10-CM

## 2021-06-14 NOTE — Progress Notes (Signed)
   Follow-Up Visit   Subjective  Chris Hernandez is a 50 y.o. male who presents for the following: suture removal/post op (Patient is here today for suture removal S/P benign cyst excision).  The following portions of the chart were reviewed this encounter and updated as appropriate:   Tobacco  Allergies  Meds  Problems  Med Hx  Surg Hx  Fam Hx      Review of Systems:  No other skin or systemic complaints except as noted in HPI or Assessment and Plan.  Objective  Well appearing patient in no apparent distress; mood and affect are within normal limits.  A focused examination was performed including the back. Relevant physical exam findings are noted in the Assessment and Plan.  L mid back paraspinal Healing excision site.  Assessment & Plan  Epidermal inclusion cyst L mid back paraspinal  Encounter for Removal of Sutures - Incision site at the L mid back paraspinal is clean, dry and intact - Wound cleansed, sutures removed, wound cleansed and steri strips applied.  - Discussed pathology results showing a benign cyst.  - Patient advised to keep steri-strips dry until they fall off. - Scars remodel for a full year. - Once steri-strips fall off, patient can apply over-the-counter silicone scar cream each night to help with scar remodeling if desired. - Patient advised to call with any concerns or if they notice any new or changing lesions.   Return for appointment as scheduled.  Luther Redo, CMA, am acting as scribe for Sarina Ser, MD .  Documentation: I have reviewed the above documentation for accuracy and completeness, and I agree with the above.  Sarina Ser, MD

## 2021-06-14 NOTE — Patient Instructions (Signed)

## 2021-06-25 ENCOUNTER — Encounter: Payer: Self-pay | Admitting: Dermatology

## 2021-08-17 ENCOUNTER — Other Ambulatory Visit: Payer: Self-pay

## 2021-09-03 MED FILL — Lisinopril Tab 5 MG: ORAL | 90 days supply | Qty: 90 | Fill #1 | Status: CN

## 2021-09-05 ENCOUNTER — Other Ambulatory Visit: Payer: Self-pay

## 2021-09-05 MED FILL — Lisinopril Tab 5 MG: ORAL | 90 days supply | Qty: 90 | Fill #1 | Status: AC

## 2021-09-08 ENCOUNTER — Other Ambulatory Visit (HOSPITAL_COMMUNITY): Payer: Self-pay

## 2021-09-20 ENCOUNTER — Encounter: Payer: Self-pay | Admitting: General Surgery

## 2021-10-05 ENCOUNTER — Other Ambulatory Visit: Payer: Self-pay

## 2021-11-17 ENCOUNTER — Other Ambulatory Visit: Payer: Self-pay

## 2021-12-05 ENCOUNTER — Other Ambulatory Visit: Payer: Self-pay | Admitting: Family

## 2021-12-06 ENCOUNTER — Other Ambulatory Visit: Payer: Self-pay

## 2021-12-06 MED ORDER — LISINOPRIL 5 MG PO TABS
ORAL_TABLET | Freq: Every day | ORAL | 3 refills | Status: DC
  Filled 2021-12-06: qty 90, 90d supply, fill #0
  Filled 2022-03-07: qty 90, 90d supply, fill #1
  Filled 2022-06-06: qty 90, 90d supply, fill #2
  Filled 2022-08-31: qty 90, 90d supply, fill #3

## 2022-02-01 ENCOUNTER — Encounter: Payer: No Typology Code available for payment source | Admitting: Dermatology

## 2022-02-06 ENCOUNTER — Other Ambulatory Visit: Payer: Self-pay

## 2022-03-07 ENCOUNTER — Other Ambulatory Visit: Payer: Self-pay

## 2022-03-08 ENCOUNTER — Other Ambulatory Visit: Payer: Self-pay

## 2022-03-20 ENCOUNTER — Encounter: Payer: No Typology Code available for payment source | Admitting: Family

## 2022-03-27 ENCOUNTER — Ambulatory Visit (INDEPENDENT_AMBULATORY_CARE_PROVIDER_SITE_OTHER): Payer: No Typology Code available for payment source | Admitting: Dermatology

## 2022-03-27 DIAGNOSIS — L820 Inflamed seborrheic keratosis: Secondary | ICD-10-CM

## 2022-03-27 DIAGNOSIS — D485 Neoplasm of uncertain behavior of skin: Secondary | ICD-10-CM

## 2022-03-27 DIAGNOSIS — L814 Other melanin hyperpigmentation: Secondary | ICD-10-CM | POA: Diagnosis not present

## 2022-03-27 DIAGNOSIS — D1800 Hemangioma unspecified site: Secondary | ICD-10-CM

## 2022-03-27 DIAGNOSIS — Z1283 Encounter for screening for malignant neoplasm of skin: Secondary | ICD-10-CM

## 2022-03-27 DIAGNOSIS — L821 Other seborrheic keratosis: Secondary | ICD-10-CM

## 2022-03-27 DIAGNOSIS — D229 Melanocytic nevi, unspecified: Secondary | ICD-10-CM

## 2022-03-27 DIAGNOSIS — L578 Other skin changes due to chronic exposure to nonionizing radiation: Secondary | ICD-10-CM

## 2022-03-27 DIAGNOSIS — D225 Melanocytic nevi of trunk: Secondary | ICD-10-CM

## 2022-03-27 DIAGNOSIS — Z86018 Personal history of other benign neoplasm: Secondary | ICD-10-CM

## 2022-03-27 NOTE — Patient Instructions (Signed)

## 2022-03-27 NOTE — Progress Notes (Signed)
? ?Follow-Up Visit ?  ?Subjective  ?Chris Hernandez is a 51 y.o. male who presents for the following: Annual Exam (Hx dysplastic nevus ). The patient presents for Total-Body Skin Exam (TBSE) for skin cancer screening and mole check.  The patient has spots, moles and lesions to be evaluated, some may be new or changing and the patient has concerns that these could be cancer. ? ?The following portions of the chart were reviewed this encounter and updated as appropriate:  ? Tobacco  Allergies  Meds  Problems  Med Hx  Surg Hx  Fam Hx   ?  ?Review of Systems:  No other skin or systemic complaints except as noted in HPI or Assessment and Plan. ? ?Objective  ?Well appearing patient in no apparent distress; mood and affect are within normal limits. ? ?A full examination was performed including scalp, head, eyes, ears, nose, lips, neck, chest, axillae, abdomen, back, buttocks, bilateral upper extremities, bilateral lower extremities, hands, feet, fingers, toes, fingernails, and toenails. All findings within normal limits unless otherwise noted below. ? ?Upper back spinal x 3, L mid and low back x 3, groin x 3 (9) ?Erythematous stuck-on, waxy papule or plaque ? ?R low back lat near the side 11.0 cm lat to spine ?1.5 x 1.0 cm irregular brown macule. ? ? ? ? ? ?Assessment & Plan  ?Inflamed seborrheic keratosis (9) ?Upper back spinal x 3, L mid and low back x 3, groin x 3 ? ?Destruction of lesion - Upper back spinal x 3, L mid and low back x 3, groin x 3 ?Complexity: simple   ?Destruction method: cryotherapy   ?Informed consent: discussed and consent obtained   ?Timeout:  patient name, date of birth, surgical site, and procedure verified ?Lesion destroyed using liquid nitrogen: Yes   ?Region frozen until ice ball extended beyond lesion: Yes   ?Outcome: patient tolerated procedure well with no complications   ?Post-procedure details: wound care instructions given   ? ?Neoplasm of uncertain behavior of skin ?R low back lat  near the side 11.0 cm lat to spine ? ?Epidermal / dermal shaving ? ?Lesion diameter (cm):  1.5 ?Informed consent: discussed and consent obtained   ?Timeout: patient name, date of birth, surgical site, and procedure verified   ?Procedure prep:  Patient was prepped and draped in usual sterile fashion ?Prep type:  Isopropyl alcohol ?Anesthesia: the lesion was anesthetized in a standard fashion   ?Anesthetic:  1% lidocaine w/ epinephrine 1-100,000 buffered w/ 8.4% NaHCO3 ?Instrument used: flexible razor blade   ?Hemostasis achieved with: pressure, aluminum chloride and electrodesiccation   ?Outcome: patient tolerated procedure well   ?Post-procedure details: sterile dressing applied and wound care instructions given   ?Dressing type: bandage and petrolatum   ? ?Specimen 1 - Surgical pathology ?Differential Diagnosis: D48.5 r/o dysplastic nevus  ?Check Margins: No ? ?Related Medications ?mupirocin ointment (BACTROBAN) 2 % ?Apply 1 application topically daily. With dressing changes ? ?Skin cancer screening ? ?Lentigines ?- Scattered tan macules ?- Due to sun exposure ?- Benign-appearing, observe ?- Recommend daily broad spectrum sunscreen SPF 30+ to sun-exposed areas, reapply every 2 hours as needed. ?- Call for any changes ? ?Seborrheic Keratoses ?- Stuck-on, waxy, tan-brown papules and/or plaques  ?- Benign-appearing ?- Discussed benign etiology and prognosis. ?- Observe ?- Call for any changes ? ?Melanocytic Nevi ?- Tan-brown and/or pink-flesh-colored symmetric macules and papules ?- Benign appearing on exam today ?- Observation ?- Call clinic for new or changing moles ?- Recommend  daily use of broad spectrum spf 30+ sunscreen to sun-exposed areas.  ? ?Hemangiomas ?- Red papules ?- Discussed benign nature ?- Observe ?- Call for any changes ? ?Actinic Damage ?- Chronic condition, secondary to cumulative UV/sun exposure ?- diffuse scaly erythematous macules with underlying dyspigmentation ?- Recommend daily broad  spectrum sunscreen SPF 30+ to sun-exposed areas, reapply every 2 hours as needed.  ?- Staying in the shade or wearing long sleeves, sun glasses (UVA+UVB protection) and wide brim hats (4-inch brim around the entire circumference of the hat) are also recommended for sun protection.  ?- Call for new or changing lesions. ? ?History of Dysplastic Nevi ?- No evidence of recurrence today ?- Recommend regular full body skin exams ?- Recommend daily broad spectrum sunscreen SPF 30+ to sun-exposed areas, reapply every 2 hours as needed.  ?- Call if any new or changing lesions are noted between office visits ? ?Skin cancer screening performed today. ? ?Return in about 1 year (around 03/28/2023) for TBSE. ? ?I, Rudell Cobb, CMA, am acting as scribe for Sarina Ser, MD . ?Documentation: I have reviewed the above documentation for accuracy and completeness, and I agree with the above. ? ?Sarina Ser, MD ? ?

## 2022-03-28 ENCOUNTER — Encounter: Payer: Self-pay | Admitting: Dermatology

## 2022-03-30 ENCOUNTER — Telehealth: Payer: Self-pay

## 2022-03-30 NOTE — Telephone Encounter (Signed)
Advised pt of bx results/sh ?

## 2022-03-30 NOTE — Telephone Encounter (Signed)
-----   Message from Ralene Bathe, MD sent at 03/30/2022  5:08 PM EDT ----- ?Diagnosis ?Skin , right low back lat near the side 11.0 cm lat to spine ?DYSPLASTIC COMPOUND NEVUS WITH MODERATE ATYPIA, LATERAL AND DEEP MARGINS INVOLVED ? ?Moderate dysplastic ?Recheck at next visit ?

## 2022-04-19 ENCOUNTER — Encounter: Payer: Self-pay | Admitting: Family

## 2022-04-19 ENCOUNTER — Ambulatory Visit (INDEPENDENT_AMBULATORY_CARE_PROVIDER_SITE_OTHER): Payer: No Typology Code available for payment source | Admitting: Family

## 2022-04-19 VITALS — BP 130/80 | HR 102 | Temp 98.1°F | Ht 71.0 in | Wt 186.4 lb

## 2022-04-19 DIAGNOSIS — Z0000 Encounter for general adult medical examination without abnormal findings: Secondary | ICD-10-CM | POA: Diagnosis not present

## 2022-04-19 DIAGNOSIS — I10 Essential (primary) hypertension: Secondary | ICD-10-CM | POA: Diagnosis not present

## 2022-04-19 DIAGNOSIS — Z125 Encounter for screening for malignant neoplasm of prostate: Secondary | ICD-10-CM

## 2022-04-19 LAB — HEMOGLOBIN A1C: Hgb A1c MFr Bld: 5.3 % (ref 4.6–6.5)

## 2022-04-19 LAB — LIPID PANEL
Cholesterol: 198 mg/dL (ref 0–200)
HDL: 43.8 mg/dL (ref >39.00)
LDL Cholesterol: 130 mg/dL — ABNORMAL HIGH (ref 0–99)
NonHDL: 153.9
Total CHOL/HDL Ratio: 5
Triglycerides: 118 mg/dL (ref 0.0–149.0)
VLDL: 23.6 mg/dL (ref 0.0–40.0)

## 2022-04-19 LAB — CBC WITH DIFFERENTIAL/PLATELET
Basophils Absolute: 0 10*3/uL (ref 0.0–0.1)
Basophils Relative: 0.7 % (ref 0.0–3.0)
Eosinophils Absolute: 0.1 10*3/uL (ref 0.0–0.7)
Eosinophils Relative: 0.9 % (ref 0.0–5.0)
HCT: 48.2 % (ref 39.0–52.0)
Hemoglobin: 16.6 g/dL (ref 13.0–17.0)
Lymphocytes Relative: 19.1 % (ref 12.0–46.0)
Lymphs Abs: 1.4 10*3/uL (ref 0.7–4.0)
MCHC: 34.4 g/dL (ref 30.0–36.0)
MCV: 85.5 fl (ref 78.0–100.0)
Monocytes Absolute: 0.6 10*3/uL (ref 0.1–1.0)
Monocytes Relative: 8.1 % (ref 3.0–12.0)
Neutro Abs: 5.2 10*3/uL (ref 1.4–7.7)
Neutrophils Relative %: 71.2 % (ref 43.0–77.0)
Platelets: 176 10*3/uL (ref 150.0–400.0)
RBC: 5.64 Mil/uL (ref 4.22–5.81)
RDW: 14.3 % (ref 11.5–15.5)
WBC: 7.3 10*3/uL (ref 4.0–10.5)

## 2022-04-19 LAB — COMPREHENSIVE METABOLIC PANEL
ALT: 16 U/L (ref 0–53)
AST: 14 U/L (ref 0–37)
Albumin: 5.1 g/dL (ref 3.5–5.2)
Alkaline Phosphatase: 38 U/L — ABNORMAL LOW (ref 39–117)
BUN: 18 mg/dL (ref 6–23)
CO2: 24 mEq/L (ref 19–32)
Calcium: 9.7 mg/dL (ref 8.4–10.5)
Chloride: 104 mEq/L (ref 96–112)
Creatinine, Ser: 0.92 mg/dL (ref 0.40–1.50)
GFR: 96.16 mL/min (ref >60.00)
Glucose, Bld: 101 mg/dL — ABNORMAL HIGH (ref 70–99)
Potassium: 4.2 mEq/L (ref 3.5–5.1)
Sodium: 138 mEq/L (ref 135–145)
Total Bilirubin: 0.8 mg/dL (ref 0.2–1.2)
Total Protein: 7.6 g/dL (ref 6.0–8.3)

## 2022-04-19 LAB — PSA: PSA: 0.41 ng/mL (ref 0.10–4.00)

## 2022-04-19 LAB — VITAMIN D 25 HYDROXY (VIT D DEFICIENCY, FRACTURES): VITD: 19.99 ng/mL — ABNORMAL LOW (ref 30.00–100.00)

## 2022-04-19 LAB — TSH: TSH: 2.17 u[IU]/mL (ref 0.35–5.50)

## 2022-04-19 NOTE — Progress Notes (Signed)
? ?Subjective:  ? ? Patient ID: Chris Hernandez, male    DOB: 03/17/1971, 51 y.o.   MRN: [id number] ? ?CC: Chris Hernandez is a 51 y.o. male who presents today for physical exam.   ? ?HPI: Feels well today.  No new complaints ? ?HLD- compliant with crestor '10mg'$ .  ?HTN-compliant with lisinopril 5 mg ?At home, BP 120/80 ?No cp, sob.  ? ?Colorectal  Cancer Screening: UTD , 06/01/21, repeat in 5 years.  ?Prostate Cancer Screening: No trouble urinating, urinary hesitancy ?Lung Cancer Screening: No 30 year pack year history and > 50 years to 40 years.  ? ?Immunizations ?      Tetanus - UTD ?     ?Labs: Screening labs today. ?Exercise: Gets regular exercise walks regularly.  ?Alcohol use:  occassional ?Smoking/tobacco use: Nonsmoker.   ?Following with dermatology, Dr Marisue Brooklyn ? ? ?HISTORY:  ?Past Medical History:  ?Diagnosis Date  ? Acid reflux   ? not for last 7 years  ? Dysplastic nevus 10/08/2008  ? right lower back post waistline  ? Dysplastic nevus 11/18/2008  ? left lower back spinal, left paraspinal low back  ? Dysplastic nevus 09/04/2018  ? left paraspinal mid to upper back, right mid back 5.0 cm lat to spine  ? Dysplastic nevus 06/11/2019  ? left ant lat side  ? Dysplastic nevus 11/11/2019  ? right lat side above waistline, right low back 5.0 cm lat to spine, right infrapectoral, right ant axillary  ? Dysplastic nevus 01/14/2020  ? right post waistline 4.0 cm lat to spine, left low back 4.0 cm lat to spine, left low back sup 2.5 cm lat to spine, right mid side, right side lat abdomen  ? Dysplastic nevus 03/27/2022  ? R low back lat near the side 11.0 cm lat to spine, moderate atypia  ? History of kidney stones   ? Hyperlipidemia   ? Hypertension   ? WEIGHT LOSS/ MED PRN  ? Kidney stones 2013  ? kidney stones  ?  ?Past Surgical History:  ?Procedure Laterality Date  ? COLONOSCOPY WITH PROPOFOL N/A 06/01/2021  ? Procedure: COLONOSCOPY WITH PROPOFOL;  Surgeon: Jonathon Bellows, MD;  Location: [facility] ENDOSCOPY;   Service: Gastroenterology;  Laterality: N/A;  ? CYST REMOVAL PEDIATRIC    ? Thyroglossal duct cyst  ? gerd    ? ENDOSCOPY  ? HEMORRHOID SURGERY N/A 09/02/2018  ? Procedure: HEMORRHOID BANDING;  Surgeon: Robert Bellow, MD;  Location: [facility] ORS;  Service: General;  Laterality: N/A;  ? INGUINAL HERNIA REPAIR Right 09/02/2018  ? Right direct and indirect inguinal hernia repair with medium Ultra Pro mesh; Surgeon: Robert Bellow, MD;  Location: [facility] ORS;  Service: General;  Laterality: Right;  ? LIPOMA EXCISION Left 09/02/2018  ? Excision left lower abdominal wall lipoma  ? WISDOM TOOTH EXTRACTION    ? ?Family History  ?Problem Relation Age of Onset  ? Arthritis Mother   ? Hypertension Mother   ? Colon polyps Mother 70  ? Heart disease Father 82  ? Hypertension Father   ? Cancer Maternal Aunt   ?     Breast  ? Hypertension Maternal Grandmother   ? Arthritis Maternal Grandmother   ?     rheumatoid  ? Cancer Maternal Aunt   ?     Breast  ? Diabetes Maternal Aunt   ? ?  ? ?ALLERGIES: Patient has no known allergies. ? ?Current Outpatient Medications on File Prior to Visit  ?Medication Sig Dispense Refill  ?  lisinopril (ZESTRIL) 5 MG tablet TAKE 1 TABLET BY MOUTH DAILY. 90 tablet 3  ? rosuvastatin (CRESTOR) 10 MG tablet TAKE 1 TABLET BY MOUTH DAILY. 90 tablet 3  ? mupirocin ointment (BACTROBAN) 2 % Apply 1 application topically daily. With dressing changes (Patient not taking: Reported on 06/14/2021) 22 g 0  ? ?No current facility-administered medications on file prior to visit.  ? ? ?Social History  ? ?Tobacco Use  ? Smoking status: Never  ? Smokeless tobacco: Never  ?Vaping Use  ? Vaping Use: Never used  ?Substance Use Topics  ? Alcohol use: Yes  ?  Comment: occasional  ? Drug use: No  ? ? ?Review of Systems  ?Constitutional:  Negative for chills and fever.  ?HENT:  Negative for congestion, ear pain, rhinorrhea, sinus pressure and sore throat.   ?Respiratory:  Negative for cough, shortness of breath and wheezing.    ?Cardiovascular:  Negative for chest pain and palpitations.  ?Gastrointestinal:  Negative for diarrhea, nausea and vomiting.  ?Genitourinary:  Negative for difficulty urinating and dysuria.  ?Musculoskeletal:  Negative for myalgias.  ?Skin:  Negative for rash.  ?Neurological:  Negative for headaches.  ?Hematological:  Negative for adenopathy.  ?   ?Objective:  ?  ?BP 130/80   Pulse (!) 102   Temp 98.1 ?F (36.7 ?C) (Oral)   Ht '5\' 11"'$  (1.803 m)   Wt 186 lb 6.4 oz (84.6 kg)   SpO2 97%   BMI 26.00 kg/m?  ? ?BP Readings from Last 3 Encounters:  ?04/23/22 130/80  ?06/01/21 118/72  ?03/18/21 110/70  ? ?Wt Readings from Last 3 Encounters:  ?04/19/22 186 lb 6.4 oz (84.6 kg)  ?06/01/21 167 lb (75.8 kg)  ?03/18/21 179 lb 12.8 oz (81.6 kg)  ? ? ?Physical Exam ?Vitals reviewed.  ?Constitutional:   ?   Appearance: He is well-developed.  ?Neck:  ?   Thyroid: No thyroid mass or thyromegaly.  ?Cardiovascular:  ?   Rate and Rhythm: Regular rhythm.  ?   Heart sounds: Normal heart sounds.  ?Pulmonary:  ?   Effort: Pulmonary effort is normal. No respiratory distress.  ?   Breath sounds: Normal breath sounds. No wheezing, rhonchi or rales.  ?Lymphadenopathy:  ?   Head:  ?   Right side of head: No submental, submandibular, tonsillar, preauricular, posterior auricular or occipital adenopathy.  ?   Left side of head: No submental, submandibular, tonsillar, preauricular, posterior auricular or occipital adenopathy.  ?   Cervical: No cervical adenopathy.  ?Skin: ?   General: Skin is warm and dry.  ?Neurological:  ?   Mental Status: He is alert.  ?Psychiatric:     ?   Speech: Speech normal.     ?   Behavior: Behavior normal.  ? ? ?   ?Assessment & Plan:  ? ?Problem List Items Addressed This Visit   ? ?  ? Cardiovascular and Mediastinum  ? HTN (hypertension)  ?  Slightly elevated for patient today.  This is unusual for him.  He will be diligent about checking blood pressure at home and if blood pressure reading remains greater than  120/80, he will give me a call so he can adjust medication.  For now, continue lisinopril 5 mg ? ?  ?  ?  ? Other  ? Annual physical exam - Primary  ?  Encouraged continued exercise.  Colonoscopy is up-to-date ? ?  ?  ? Relevant Orders  ? CBC with Differential/Platelet (Completed)  ? Comprehensive metabolic  panel (Completed)  ? Hemoglobin A1c (Completed)  ? Lipid panel (Completed)  ? TSH (Completed)  ? VITAMIN D 25 Hydroxy (Vit-D Deficiency, Fractures) (Completed)  ? PSA (Completed)  ? ? ? ?I am having Chris Hernandez. Chris Noa "Gerald Stabs" maintain his rosuvastatin, mupirocin ointment, and lisinopril. ? ? ?No orders of the defined types were placed in this encounter. ? ? ?Return precautions given.  ? ?Risks, benefits, and alternatives of the medications and treatment plan prescribed today were discussed, and patient expressed understanding.  ? ?Education regarding symptom management and diagnosis given to patient on AVS.  ? ?Continue to follow with Burnard Hawthorne, FNP for routine health maintenance.  ? ?[name] and I agreed with plan.  ? ?[name], FNP ? ? ?

## 2022-04-23 NOTE — Assessment & Plan Note (Signed)
Encouraged continued exercise.  Colonoscopy is up-to-date ?

## 2022-04-23 NOTE — Assessment & Plan Note (Signed)
Slightly elevated for patient today.  This is unusual for him.  He will be diligent about checking blood pressure at home and if blood pressure reading remains greater than 120/80, he will give me a call so he can adjust medication.  For now, continue lisinopril 5 mg ?

## 2022-04-23 NOTE — Patient Instructions (Signed)
It is imperative that you are seen AT least twice per year for labs and monitoring. Monitor blood pressure at home and me 5-6 reading on separate days. Goal is less than 120/80, based on newest guidelines, however we certainly want to be less than 130/80;  if persistently higher, please make sooner follow up appointment so we can recheck you blood pressure and manage/ adjust medications. ? ?Health Maintenance, Male ?Adopting a healthy lifestyle and getting preventive care are important in promoting health and wellness. Ask your health care provider about: ?The right schedule for you to have regular tests and exams. ?Things you can do on your own to prevent diseases and keep yourself healthy. ?What should I know about diet, weight, and exercise? ?Eat a healthy diet ? ?Eat a diet that includes plenty of vegetables, fruits, low-fat dairy products, and lean protein. ?Do not eat a lot of foods that are high in solid fats, added sugars, or sodium. ?Maintain a healthy weight ?Body mass index (BMI) is a measurement that can be used to identify possible weight problems. It estimates body fat based on height and weight. Your health care provider can help determine your BMI and help you achieve or maintain a healthy weight. ?Get regular exercise ?Get regular exercise. This is one of the most important things you can do for your health. Most adults should: ?Exercise for at least 150 minutes each week. The exercise should increase your heart rate and make you sweat (moderate-intensity exercise). ?Do strengthening exercises at least twice a week. This is in addition to the moderate-intensity exercise. ?Spend less time sitting. Even light physical activity can be beneficial. ?Watch cholesterol and blood lipids ?Have your blood tested for lipids and cholesterol at 52 years of age, then have this test every 5 years. ?You may need to have your cholesterol levels checked more often if: ?Your lipid or cholesterol levels are high. ?You  are older than 52 years of age. ?You are at high risk for heart disease. ?What should I know about cancer screening? ?Many types of cancers can be detected early and may often be prevented. Depending on your health history and family history, you may need to have cancer screening at various ages. This may include screening for: ?Colorectal cancer. ?Prostate cancer. ?Skin cancer. ?Lung cancer. ?What should I know about heart disease, diabetes, and high blood pressure? ?Blood pressure and heart disease ?High blood pressure causes heart disease and increases the risk of stroke. This is more likely to develop in people who have high blood pressure readings or are overweight. ?Talk with your health care provider about your target blood pressure readings. ?Have your blood pressure checked: ?Every 3-5 years if you are 72-40 years of age. ?Every year if you are 67 years old or older. ?If you are between the ages of 33 and 54 and are a current or former smoker, ask your health care provider if you should have a one-time screening for abdominal aortic aneurysm (AAA). ?Diabetes ?Have regular diabetes screenings. This checks your fasting blood sugar level. Have the screening done: ?Once every three years after age 71 if you are at a normal weight and have a low risk for diabetes. ?More often and at a younger age if you are overweight or have a high risk for diabetes. ?What should I know about preventing infection? ?Hepatitis B ?If you have a higher risk for hepatitis B, you should be screened for this virus. Talk with your health care provider to find out if  you are at risk for hepatitis B infection. ?Hepatitis C ?Blood testing is recommended for: ?Everyone born from [birth year] through 1966. ?Anyone with known risk factors for hepatitis C. ?Sexually transmitted infections (STIs) ?You should be screened each year for STIs, including gonorrhea and chlamydia, if: ?You are sexually active and are younger than 52 years of age. ?You are  older than 52 years of age and your health care provider tells you that you are at risk for this type of infection. ?Your sexual activity has changed since you were last screened, and you are at increased risk for chlamydia or gonorrhea. Ask your health care provider if you are at risk. ?Ask your health care provider about whether you are at high risk for HIV. Your health care provider may recommend a prescription medicine to help prevent HIV infection. If you choose to take medicine to prevent HIV, you should first get tested for HIV. You should then be tested every 3 months for as long as you are taking the medicine. ?Follow these instructions at home: ?Alcohol use ?Do not drink alcohol if your health care provider tells you not to drink. ?If you drink alcohol: ?Limit how much you have to 0-2 drinks a day. ?Know how much alcohol is in your drink. In the [place] one drink equals one 12 oz bottle of beer (355 mL), one 5 oz glass of wine (148 mL), or one 1? oz glass of hard liquor (44 mL). ?Lifestyle ?Do not use any products that contain nicotine or tobacco. These products include cigarettes, chewing tobacco, and vaping devices, such as e-cigarettes. If you need help quitting, ask your health care provider. ?Do not use street drugs. ?Do not share needles. ?Ask your health care provider for help if you need support or information about quitting drugs. ?General instructions ?Schedule regular health, dental, and eye exams. ?Stay current with your vaccines. ?Tell your health care provider if: ?You often feel depressed. ?You have ever been abused or do not feel safe at home. ?Summary ?Adopting a healthy lifestyle and getting preventive care are important in promoting health and wellness. ?Follow your health care provider's instructions about healthy diet, exercising, and getting tested or screened for diseases. ?Follow your health care provider's instructions on monitoring your cholesterol and blood pressure. ?This  information is not intended to replace advice given to you by your health care provider. Make sure you discuss any questions you have with your health care provider. ?Document Revised: 04/25/2021 Document Reviewed: 04/25/2021 ?Elsevier Patient Education ? [place]. ? ?

## 2022-04-24 ENCOUNTER — Other Ambulatory Visit: Payer: Self-pay | Admitting: Family

## 2022-04-24 ENCOUNTER — Other Ambulatory Visit: Payer: Self-pay

## 2022-04-24 DIAGNOSIS — E559 Vitamin D deficiency, unspecified: Secondary | ICD-10-CM

## 2022-04-24 MED ORDER — CHOLECALCIFEROL 1.25 MG (50000 UT) PO CAPS
ORAL_CAPSULE | ORAL | 0 refills | Status: DC
  Filled 2022-04-24: qty 8, 56d supply, fill #0

## 2022-04-26 ENCOUNTER — Telehealth: Payer: Self-pay

## 2022-04-26 NOTE — Telephone Encounter (Signed)
LMTCB to see if patient willing to increase Crestor to 20 mg & CBC will be needed in 6 weeks.  ?

## 2022-04-28 ENCOUNTER — Other Ambulatory Visit: Payer: Self-pay

## 2022-04-28 MED ORDER — ROSUVASTATIN CALCIUM 20 MG PO TABS
20.0000 mg | ORAL_TABLET | Freq: Every day | ORAL | 3 refills | Status: DC
  Filled 2022-04-28: qty 90, 90d supply, fill #0
  Filled 2022-07-27: qty 90, 90d supply, fill #1
  Filled 2022-10-27: qty 90, 90d supply, fill #2
  Filled 2023-02-05: qty 90, 90d supply, fill #3

## 2022-04-28 NOTE — Telephone Encounter (Signed)
Pt called back and I read the message to him and pt agreed to increase the crestor to 20 mg  ?

## 2022-05-03 ENCOUNTER — Other Ambulatory Visit: Payer: Self-pay

## 2022-05-03 DIAGNOSIS — E785 Hyperlipidemia, unspecified: Secondary | ICD-10-CM

## 2022-05-03 NOTE — Telephone Encounter (Signed)
Lab in 6 weeks scheduled & ordered.  ?

## 2022-06-06 ENCOUNTER — Other Ambulatory Visit: Payer: Self-pay

## 2022-06-09 ENCOUNTER — Telehealth: Payer: Self-pay | Admitting: Family

## 2022-06-12 ENCOUNTER — Other Ambulatory Visit (INDEPENDENT_AMBULATORY_CARE_PROVIDER_SITE_OTHER): Payer: No Typology Code available for payment source

## 2022-06-12 DIAGNOSIS — E785 Hyperlipidemia, unspecified: Secondary | ICD-10-CM | POA: Diagnosis not present

## 2022-06-12 LAB — COMPREHENSIVE METABOLIC PANEL
ALT: 12 U/L (ref 0–53)
AST: 15 U/L (ref 0–37)
Albumin: 4.5 g/dL (ref 3.5–5.2)
Alkaline Phosphatase: 36 U/L — ABNORMAL LOW (ref 39–117)
BUN: 16 mg/dL (ref 6–23)
CO2: 25 mEq/L (ref 19–32)
Calcium: 9.2 mg/dL (ref 8.4–10.5)
Chloride: 107 mEq/L (ref 96–112)
Creatinine, Ser: 1.03 mg/dL (ref 0.40–1.50)
GFR: 83.88 mL/min (ref >60.00)
Glucose, Bld: 112 mg/dL — ABNORMAL HIGH (ref 70–99)
Potassium: 4.1 mEq/L (ref 3.5–5.1)
Sodium: 140 mEq/L (ref 135–145)
Total Bilirubin: 0.8 mg/dL (ref 0.2–1.2)
Total Protein: 6.5 g/dL (ref 6.0–8.3)

## 2022-07-28 ENCOUNTER — Other Ambulatory Visit: Payer: Self-pay

## 2022-08-25 ENCOUNTER — Telehealth: Payer: Self-pay | Admitting: Family

## 2022-08-25 NOTE — Telephone Encounter (Signed)
MyChart message:  Appointment Request From: [name]   With Provider: [name], FNP [facility] [place]]   Preferred Date Range: 08/25/2022 - 08/25/2022   Preferred Times: Any Time   Reason for visit: Request an Appointment   Comments: I may have an innear ear infection.  I have had some dizziness when laying down or getting up and now I have a swollen lump in front of my ear that is tender.  I"m not sure if Vidal Schwalbe is available or someone else there to be seen Friday 9/8 but hoping someone is.

## 2022-08-25 NOTE — Telephone Encounter (Signed)
Called and spoke  to patient and got him an acute visit on 08/28/22 with Joycelyn Schmid

## 2022-08-28 ENCOUNTER — Encounter: Payer: Self-pay | Admitting: Family

## 2022-08-28 ENCOUNTER — Ambulatory Visit (INDEPENDENT_AMBULATORY_CARE_PROVIDER_SITE_OTHER): Payer: No Typology Code available for payment source | Admitting: Family

## 2022-08-28 ENCOUNTER — Other Ambulatory Visit: Payer: Self-pay

## 2022-08-28 VITALS — BP 130/88 | HR 90 | Temp 98.0°F | Ht 71.0 in | Wt 191.4 lb

## 2022-08-28 DIAGNOSIS — H6692 Otitis media, unspecified, left ear: Secondary | ICD-10-CM | POA: Insufficient documentation

## 2022-08-28 DIAGNOSIS — I10 Essential (primary) hypertension: Secondary | ICD-10-CM | POA: Diagnosis not present

## 2022-08-28 MED ORDER — AMOXICILLIN-POT CLAVULANATE 875-125 MG PO TABS
1.0000 | ORAL_TABLET | Freq: Two times a day (BID) | ORAL | 0 refills | Status: AC
  Filled 2022-08-28: qty 14, 7d supply, fill #0

## 2022-08-28 NOTE — Patient Instructions (Addendum)
As discussed, I think it is reasonable to start Augmentin.  Ensure to take probiotics while on antibiotics and also for 2 weeks after completion. This can either be by eating yogurt daily or taking a probiotic supplement over the counter such as Culturelle.It is important to re-colonize the gut with good bacteria and also to prevent any diarrheal infections associated with antibiotic use.   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   You may look videos for Epley's maneuvers as we discussed online.   If dizziness/vertigo persists, a referral to ENT for further evaluation and medication may be appropriate.   If there is no improvement in your symptoms, or if there is any worsening of symptoms, or if you have any additional concerns, please return to this clinic for re-evaluation; or, if we are closed, consider going to the Emergency Room for evaluation.   What is BPPV?  BPPV  is one of the most common causes of vertigo -- the sudden sensation that you're spinning or that the inside of your head is spinning. Benign paroxysmal positional vertigo causes brief episodes of mild to intense dizziness. Benign paroxysmal positional vertigo is usually triggered by specific changes in the position of your head. This might occur when you tip your head up or down, when you lie down, or when you turn over or sit up in bed. Although benign paroxysmal positional vertigo can be a bothersome problem, it's rarely serious except when it increases the chance of falls.  If you experience dizziness associated with benign paroxysmal positional vertigo (BPPV), consider these tips: Be aware of the possibility of losing your balance, which can lead to  falling and serious injury.  Sit down immediately when you feel dizzy.  Use good lighting if you get up at night.  Walk with a cane for stability if you're at risk of falling.  Work closely with your doctor to manage your symptoms effectively. BPPV may recur even after successful therapy. Fortunately, although there's no cure, the condition can be managed with physical therapy and home treatments.   Dizziness [Uncertain Cause] Dizziness is a common symptom sometimes described as "lightheadedness" or feeling like you are going to faint. If it lasts for only a few seconds and is related to changes in position (such as getting up after lying or sitting for a long time), it is usually not a sign of anything serious. Dizziness that lasts for minutes to hours, or comes on for no apparent reason, may be a sign of a more serious problem (such as dehydration, a medicine reaction, disease of the heart or brain). Today's exam did not show an exact cause for your dizzy spell . Sometimes additional tests are required before a cause can be found. Therefore, it is important to follow up with your doctor if your symptoms continue. Home Care: 1) If a dizzy spell occurs and lasts more than a few seconds, lie down until it passes. If you are lying down, then you cannot hurt yourself by falling if you do faint. 2) Do not drive or operate dangerous equipment until the dizzy spells have stopped for at least 48 hours. 3) If dizzy spells occur with sudden standing, this may be  a sign of mild dehydration. Drink extra fluids over the next few days. 4) If you recently started a new medicine or if you had the dose of a current medicine increased (especially blood pressure medicine), talk with the prescribing doctor about your symptoms. Dose adjustments may be needed. Follow Up with your doctor for further evaluation within the next seven days, if your symptoms continue. Get Prompt Medical Attention if any of the following  occur: -- Worsening of your symptoms -- Fainting, headache or seizure -- Repeated vomiting -- Feeling like you or the room is spinning -- Chest, arm, neck, back or jaw pain -- Palpitations (the sense that your heart is fluttering or beating fast or hard) -- Shortness of breath -- Blood in vomit or stool (black or red color) -- Weakness of an arm or leg or one side of the face -- Difficulty with speech or vision  2000-2015 [employer] [address], [place] [address]. All rights reserved. This information is not intended as a substitute for professional medical care. Always follow your healthcare professional's instructions.

## 2022-08-28 NOTE — Progress Notes (Signed)
Subjective:    Patient ID: Chris Hernandez, male    DOB: December 30, 1970, 51 y.o.   MRN: [id number]  CC: Chris Hernandez is a 51 y.o. male who presents today for an acute visit.    HPI: Complains of eipsodic vertigo he experiences a moving sensation.  Head 'will spin for 10 seconds' .   Feels left ear pressure. Scant nasal congestion.   Denies dizziness, syncope. HA, vision changes, pain with eating, teeth pain, facial pain, fever, ear discharge, tinnitus, hearing loss  He has taken ibuprofen without change in symptom .    Symptom only occurs when laying supine and rolls to left side.  Previous episode in which he had similar symptoms and treated for ear infection with resolution.   No associated cp, palpiations.    He endorses dietary indiscretion which suspects contributory to blood presure.  He is compliant lisinopril 5 mg  Nonsmoker   HISTORY:  Past Medical History:  Diagnosis Date   Acid reflux    not for last 7 years   Dysplastic nevus 10/08/2008   right lower back post waistline   Dysplastic nevus 11/18/2008   left lower back spinal, left paraspinal low back   Dysplastic nevus 09/04/2018   left paraspinal mid to upper back, right mid back 5.0 cm lat to spine   Dysplastic nevus 06/11/2019   left ant lat side   Dysplastic nevus 11/11/2019   right lat side above waistline, right low back 5.0 cm lat to spine, right infrapectoral, right ant axillary   Dysplastic nevus 01/14/2020   right post waistline 4.0 cm lat to spine, left low back 4.0 cm lat to spine, left low back sup 2.5 cm lat to spine, right mid side, right side lat abdomen   Dysplastic nevus 03/27/2022   R low back lat near the side 11.0 cm lat to spine, moderate atypia   History of kidney stones    Hyperlipidemia    Hypertension    WEIGHT LOSS/ MED PRN   Kidney stones 2013   kidney stones   Past Surgical History:  Procedure Laterality Date   COLONOSCOPY WITH PROPOFOL N/A 06/01/2021    Procedure: COLONOSCOPY WITH PROPOFOL;  Surgeon: Jonathon Bellows, MD;  Location: [facility] ENDOSCOPY;  Service: Gastroenterology;  Laterality: N/A;   CYST REMOVAL PEDIATRIC     Thyroglossal duct cyst   gerd     ENDOSCOPY   HEMORRHOID SURGERY N/A 09/02/2018   Procedure: HEMORRHOID BANDING;  Surgeon: Robert Bellow, MD;  Location: [facility] ORS;  Service: General;  Laterality: N/A;   INGUINAL HERNIA REPAIR Right 09/02/2018   Right direct and indirect inguinal hernia repair with medium Ultra Pro mesh; Surgeon: Robert Bellow, MD;  Location: [facility] ORS;  Service: General;  Laterality: Right;   LIPOMA EXCISION Left 09/02/2018   Excision left lower abdominal wall lipoma   WISDOM TOOTH EXTRACTION     Family History  Problem Relation Age of Onset   Arthritis Mother    Hypertension Mother    Colon polyps Mother 95   Heart disease Father 83   Hypertension Father    Cancer Maternal Aunt        Breast   Hypertension Maternal Grandmother    Arthritis Maternal Grandmother        rheumatoid   Cancer Maternal Aunt        Breast   Diabetes Maternal Aunt     Allergies: Patient has no known allergies. Current Outpatient Medications on File Prior to  Visit  Medication Sig Dispense Refill   Cholecalciferol 1.25 MG (50000 UT) capsule Take 1 capsule by mouth every week for 8 weeks 8 capsule 0   lisinopril (ZESTRIL) 5 MG tablet TAKE 1 TABLET BY MOUTH DAILY. 90 tablet 3   rosuvastatin (CRESTOR) 20 MG tablet Take 1 tablet (20 mg total) by mouth daily. 90 tablet 3   No current facility-administered medications on file prior to visit.    Social History   Tobacco Use   Smoking status: Never   Smokeless tobacco: Never  Vaping Use   Vaping Use: Never used  Substance Use Topics   Alcohol use: Yes    Comment: occasional   Drug use: No    Review of Systems  Constitutional:  Negative for chills and fever.  HENT:  Positive for ear pain (left ear pressure).   Eyes:  Negative for visual disturbance.   Respiratory:  Negative for cough.   Cardiovascular:  Negative for chest pain and palpitations.  Gastrointestinal:  Negative for nausea and vomiting.  Neurological:  Negative for dizziness and headaches.      Objective:    BP 130/88 (BP Location: Left Arm, Patient Position: Sitting, Cuff Size: Large)   Pulse 90   Temp 98 F (36.7 C) (Oral)   Ht '5\' 11"'$  (1.803 m)   Wt 191 lb 6.4 oz (86.8 kg)   SpO2 97%   BMI 26.69 kg/m    Physical Exam Vitals reviewed.  Constitutional:      Appearance: He is well-developed.  HENT:     Head: Normocephalic and atraumatic.     Comments: No trismus    Right Ear: Hearing, tympanic membrane, ear canal and external ear normal. No decreased hearing noted. No drainage, swelling or tenderness. No middle ear effusion. Tympanic membrane is not injected, erythematous or bulging.     Left Ear: Hearing, tympanic membrane, ear canal and external ear normal. No decreased hearing noted. No drainage, swelling or tenderness.  No middle ear effusion. Tympanic membrane is not injected, erythematous or bulging.     Nose: Nose normal.     Right Sinus: No maxillary sinus tenderness or frontal sinus tenderness.     Left Sinus: No maxillary sinus tenderness or frontal sinus tenderness.     Mouth/Throat:     Pharynx: Uvula midline. No oropharyngeal exudate or posterior oropharyngeal erythema.     Tonsils: No tonsillar abscesses.  Eyes:     General: Lids are normal. Lids are everted, no foreign bodies appreciated.     Conjunctiva/sclera: Conjunctivae normal.     Pupils: Pupils are equal, round, and reactive to light.     Comments: Normal fundus bilaterally   Cardiovascular:     Rate and Rhythm: Regular rhythm.     Heart sounds: Normal heart sounds.  Pulmonary:     Effort: Pulmonary effort is normal. No respiratory distress.     Breath sounds: Normal breath sounds. No wheezing, rhonchi or rales.  Lymphadenopathy:     Head:     Right side of head: No submental,  submandibular, tonsillar, preauricular, posterior auricular or occipital adenopathy.     Left side of head: No submental, submandibular, tonsillar, preauricular, posterior auricular or occipital adenopathy.     Cervical: No cervical adenopathy.  Skin:    General: Skin is warm and dry.  Neurological:     Mental Status: He is alert.     Cranial Nerves: No cranial nerve deficit.     Sensory: No sensory deficit.  Deep Tendon Reflexes:     Reflex Scores:      Bicep reflexes are 2+ on the right side and 2+ on the left side.      Patellar reflexes are 2+ on the right side and 2+ on the left side.    Comments: Grip equal and strong bilateral upper extremities. Gait strong and steady. Able to perform  finger-to-nose without difficulty.  Dix hall pike maneuver did not elicit vertigo. No nystagmus noted.    Psychiatric:        Speech: Speech normal.        Behavior: Behavior normal.        Assessment & Plan:   Problem List Items Addressed This Visit       Cardiovascular and Mediastinum   HTN (hypertension)    Slightly elevated today. Continue lisinopril '5mg'$ . Will monitor closely, reassess at follow up.         Nervous and Auditory   Left otitis media - Primary    Reassuring neurologic exam. Discussed at length vertigo which interestingly only occurs when on left side, supine. Question where otitis media, eustachian tube dysfunction contributory versus BPPV. Based on duration of symptoms and pressure in left ear, we have opted to start augmentin. Close follow up.       Relevant Medications   amoxicillin-clavulanate (AUGMENTIN) 875-125 MG tablet      I have discontinued Judeth Cornfield. Brach "Chris"'s mupirocin ointment. I am also having him start on amoxicillin-clavulanate. Additionally, I am having him maintain his lisinopril, Cholecalciferol, and rosuvastatin.   Meds ordered this encounter  Medications   amoxicillin-clavulanate (AUGMENTIN) 875-125 MG tablet    Sig: Take 1  tablet by mouth 2 (two) times daily for 7 days.    Dispense:  14 tablet    Refill:  0    Order Specific Question:   Supervising Provider    Answer:   Crecencio Mc [[id number]]    Return precautions given.   Risks, benefits, and alternatives of the medications and treatment plan prescribed today were discussed, and patient expressed understanding.   Education regarding symptom management and diagnosis given to patient on AVS.  Continue to follow with Burnard Hawthorne, FNP for routine health maintenance.   [name] and I agreed with plan.   [name], FNP

## 2022-08-29 NOTE — Assessment & Plan Note (Signed)
Reassuring neurologic exam. Discussed at length vertigo which interestingly only occurs when on left side, supine. Question where otitis media, eustachian tube dysfunction contributory versus BPPV. Based on duration of symptoms and pressure in left ear, we have opted to start augmentin. Close follow up.

## 2022-08-29 NOTE — Assessment & Plan Note (Signed)
Slightly elevated today. Continue lisinopril '5mg'$ . Will monitor closely, reassess at follow up.

## 2022-08-31 ENCOUNTER — Other Ambulatory Visit: Payer: Self-pay

## 2022-09-12 ENCOUNTER — Ambulatory Visit: Payer: No Typology Code available for payment source | Admitting: Family

## 2022-10-16 ENCOUNTER — Encounter (INDEPENDENT_AMBULATORY_CARE_PROVIDER_SITE_OTHER): Payer: Self-pay

## 2022-10-27 ENCOUNTER — Other Ambulatory Visit: Payer: Self-pay

## 2022-11-25 ENCOUNTER — Emergency Department: Payer: No Typology Code available for payment source

## 2022-11-25 ENCOUNTER — Other Ambulatory Visit: Payer: Self-pay

## 2022-11-25 ENCOUNTER — Emergency Department
Admission: EM | Admit: 2022-11-25 | Discharge: 2022-11-25 | Disposition: A | Discharge status: Home / Self Care | Payer: No Typology Code available for payment source | Attending: Emergency Medicine | Admitting: Emergency Medicine

## 2022-11-25 ENCOUNTER — Other Ambulatory Visit (HOSPITAL_COMMUNITY): Payer: Self-pay

## 2022-11-25 DIAGNOSIS — N23 Unspecified renal colic: Secondary | ICD-10-CM | POA: Insufficient documentation

## 2022-11-25 DIAGNOSIS — R109 Unspecified abdominal pain: Secondary | ICD-10-CM | POA: Diagnosis present

## 2022-11-25 LAB — CBC WITH DIFFERENTIAL/PLATELET
Abs Immature Granulocytes: 0.01 10*3/uL (ref 0.00–0.07)
Basophils Absolute: 0.1 10*3/uL (ref 0.0–0.1)
Basophils Relative: 1 %
Eosinophils Absolute: 0.2 10*3/uL (ref 0.0–0.5)
Eosinophils Relative: 3 %
HCT: 46.8 % (ref 39.0–52.0)
Hemoglobin: 16 g/dL (ref 13.0–17.0)
Immature Granulocytes: 0 %
Lymphocytes Relative: 42 %
Lymphs Abs: 3 10*3/uL (ref 0.7–4.0)
MCH: 28.9 pg (ref 26.0–34.0)
MCHC: 34.2 g/dL (ref 30.0–36.0)
MCV: 84.5 fL (ref 80.0–100.0)
Monocytes Absolute: 0.6 10*3/uL (ref 0.1–1.0)
Monocytes Relative: 9 %
Neutro Abs: 3.3 10*3/uL (ref 1.7–7.7)
Neutrophils Relative %: 45 %
Platelets: 206 10*3/uL (ref 150–400)
RBC: 5.54 MIL/uL (ref 4.22–5.81)
RDW: 13.9 % (ref 11.5–15.5)
WBC: 7.1 10*3/uL (ref 4.0–10.5)
nRBC: 0 % (ref 0.0–0.2)

## 2022-11-25 LAB — URINALYSIS, ROUTINE W REFLEX MICROSCOPIC
Bacteria, UA: NONE SEEN
Bilirubin Urine: NEGATIVE
Glucose, UA: NEGATIVE mg/dL
Ketones, ur: NEGATIVE mg/dL
Leukocytes,Ua: NEGATIVE
Nitrite: NEGATIVE
Protein, ur: NEGATIVE mg/dL
Specific Gravity, Urine: 1.02 (ref 1.005–1.030)
Squamous Epithelial / HPF: NONE SEEN (ref 0–5)
pH: 5 (ref 5.0–8.0)

## 2022-11-25 LAB — BASIC METABOLIC PANEL
Anion gap: 9 (ref 5–15)
BUN: 18 mg/dL (ref 6–20)
CO2: 19 mmol/L — ABNORMAL LOW (ref 22–32)
Calcium: 9.4 mg/dL (ref 8.9–10.3)
Chloride: 113 mmol/L — ABNORMAL HIGH (ref 98–111)
Creatinine, Ser: 1.11 mg/dL (ref 0.61–1.24)
GFR, Estimated: >60 mL/min (ref >60)
Glucose, Bld: 138 mg/dL — ABNORMAL HIGH (ref 70–99)
Potassium: 4.3 mmol/L (ref 3.5–5.1)
Sodium: 141 mmol/L (ref 135–145)

## 2022-11-25 MED ORDER — OXYCODONE-ACETAMINOPHEN 5-325 MG PO TABS
1.0000 | ORAL_TABLET | Freq: Once | ORAL | Status: AC
  Administered 2022-11-25: 1 via ORAL
  Filled 2022-11-25: qty 1

## 2022-11-25 MED ORDER — ONDANSETRON 8 MG PO TBDP
8.0000 mg | ORAL_TABLET | Freq: Once | ORAL | Status: AC
  Administered 2022-11-25: 8 mg via ORAL
  Filled 2022-11-25: qty 1

## 2022-11-25 MED ORDER — KETOROLAC TROMETHAMINE 10 MG PO TABS
10.0000 mg | ORAL_TABLET | Freq: Four times a day (QID) | ORAL | 0 refills | Status: AC | PRN
  Filled 2022-11-25 (×2): qty 12, 3d supply, fill #0

## 2022-11-25 MED ORDER — KETOROLAC TROMETHAMINE 15 MG/ML IJ SOLN
15.0000 mg | Freq: Once | INTRAMUSCULAR | Status: AC
  Administered 2022-11-25: 15 mg via INTRAMUSCULAR
  Filled 2022-11-25: qty 1

## 2022-11-25 NOTE — ED Triage Notes (Signed)
Pt states that he woke up this morning with flank pain on the R side- pt has a hx of kidney stones and states this feels similar- pt states he has never had to have surgery on his kidney stones before, that he has been able pass them on his own

## 2022-11-25 NOTE — ED Provider Triage Note (Signed)
Emergency Medicine Provider Triage Evaluation Note  Chris Hernandez , a 51 y.o. male  was evaluated in triage.  Pt complains of right flank pain that began 30 minutes ago. History of kidney stones x6 times. Nausea without vomiting. Trouble urinating. No hematuria.  Has always been able to pass his kidney stones.  Review of Systems  Positive: Flank pain Negative: Vomiting, fever  Physical Exam  There were no vitals taken for this visit. Gen:   Awake, no distress. Appears uncomfortable Resp:  Normal effort  MSK:   Moves extremities without difficulty  Other:    Medical Decision Making  Medically screening exam initiated at 8:35 AM.  Appropriate orders placed.  Chris Hernandez was informed that the remainder of the evaluation will be completed by another provider, this initial triage assessment does not replace that evaluation, and the importance of remaining in the ED until their evaluation is complete.     Marquette Old, PA-C 11/25/22 [phone]

## 2022-11-25 NOTE — Discharge Instructions (Addendum)
Your CT scan today shows a small kidney stone just outside the bladder on your right side.  This should pass to your bladder very soon at which point you will feel much better.  Continue taking anti-inflammatory medicine as needed.  The CT scan also shows a few small lesions in your liver, which are unable to be characterized by CT.  This is not related to your pain today.  Please follow up with your doctor to arrange an MRI to further evaluate this finding.  CT summary: IMPRESSION:  1. Mild right hydronephrosis and hydroureter secondary to 2 mm  distal right ureteral calculus.  2. There are 3 indeterminate lesions identified within the liver.  When the patient is clinically stable and able to follow directions  and hold their breath (preferably as an outpatient) further  evaluation with dedicated abdominal MRI should be considered.  3. Sigmoid diverticulosis without signs of acute diverticulitis.  4.  Aortic Atherosclerosis (ICD10-I70.0).

## 2022-11-25 NOTE — ED Provider Notes (Signed)
[facility] Provider Note    Event Date/Time   First MD Initiated Contact with Patient 11/25/22 [phone]     (approximate)   History   Chief Complaint: Flank Pain   HPI  Chris Hernandez is a 51 y.o. male with a history of kidney stones, hypertension who comes ED complaining of right flank pain that woke him up this morning, feels like prior kidney stone episodes.  Not radiating.  No dysuria frequency urgency hematuria.  No fever.  No complaints.  He was in his usual state of health before this pain onset this morning.  Last kidney stone was about 8 years ago.  He previously had stone analysis and was told to avoid tea and grits in his diet which he has adhered to.     Physical Exam   Triage Vital Signs: ED Triage Vitals  Enc Vitals Group     BP 11/25/22 0837 (!) 143/106     Pulse Rate 11/25/22 0837 98     Resp 11/25/22 0837 18     Temp 11/25/22 0837 97.6 F (36.4 C)     Temp Source 11/25/22 0837 Oral     SpO2 11/25/22 0837 97 %     Weight 11/25/22 0839 185 lb (83.9 kg)     Height 11/25/22 0839 '5\' 11"'$  (1.803 m)     Head Circumference --      Peak Flow --      Pain Score 11/25/22 0839 8     Pain Loc --      Pain Edu? --      Excl. in [place]? --     Most recent vital signs: Vitals:   11/25/22 0837  BP: (!) 143/106  Pulse: 98  Resp: 18  Temp: 97.6 F (36.4 C)  SpO2: 97%    General: Awake, no distress.  CV:  Good peripheral perfusion.  Normal distal pulses Resp:  Normal effort.  Abd:  No distention.  Soft nontender    ED Results / Procedures / Treatments   Labs (all labs ordered are listed, but only abnormal results are displayed) Labs Reviewed  BASIC METABOLIC PANEL - Abnormal; Notable for the following components:      Result Value   Chloride 113 (*)    CO2 19 (*)    Glucose, Bld 138 (*)    All other components within normal limits  URINALYSIS, ROUTINE W REFLEX MICROSCOPIC - Abnormal; Notable for the following components:    Color, Urine YELLOW (*)    APPearance CLEAR (*)    Hgb urine dipstick SMALL (*)    All other components within normal limits  CBC WITH DIFFERENTIAL/PLATELET     EKG    RADIOLOGY CT abdomen pelvis interpreted by me, shows a 1 to 2 mm stone in the right ureter distally near the UVJ.  Mild hydronephrosis.  Radiology report reviewed.   PROCEDURES:  Procedures   MEDICATIONS ORDERED IN ED: Medications  ketorolac (TORADOL) 15 MG/ML injection 15 mg (15 mg Intramuscular Given 11/25/22 0922)  oxyCODONE-acetaminophen (PERCOCET/ROXICET) 5-325 MG per tablet 1 tablet (1 tablet Oral Given 11/25/22 0921)  ondansetron (ZOFRAN-ODT) disintegrating tablet 8 mg (8 mg Oral Given 11/25/22 0921)     IMPRESSION / MDM / ASSESSMENT AND PLAN / ED COURSE  I reviewed the triage vital signs and the nursing notes.  Differential diagnosis includes, but is not limited to, ureterolithiasis, dysuria, AKI, electrolyte abnormality, musculoskeletal pain  Patient's presentation is most consistent with acute presentation with potential threat to life or bodily function.  Patient presents with flank pain, most likely renal colic.  Labs are all normal, vitals normal.  Patient clearly in pain but nontoxic.  CT shows 2 mm stone in the distal right ureter explaining his symptoms.  Patient given IM Toradol, Percocet with good pain control.  Will continue on Toradol at home.  Expect stone to pass imminently without further interventions.  CT incidentally noted a few indeterminate lesions in the liver, patient has been informed and recommended to follow-up with primary care for outpatient MRI.       FINAL CLINICAL IMPRESSION(S) / ED DIAGNOSES   Final diagnoses:  Ureteral colic     Rx / DC Orders   ED Discharge Orders          Ordered    ketorolac (TORADOL) 10 MG tablet  Every 6 hours PRN       Note to Pharmacy: Continuation of parenteral therapy   11/25/22 0926              Note:  This document was prepared using Dragon voice recognition software and may include unintentional dictation errors.   Carrie Mew, MD 11/25/22 [phone]

## 2022-11-27 ENCOUNTER — Encounter: Payer: Self-pay | Admitting: Family

## 2022-11-29 ENCOUNTER — Encounter: Payer: Self-pay | Admitting: Family

## 2022-11-29 ENCOUNTER — Other Ambulatory Visit: Payer: Self-pay | Admitting: Family

## 2022-11-29 DIAGNOSIS — K769 Liver disease, unspecified: Secondary | ICD-10-CM | POA: Insufficient documentation

## 2022-11-29 NOTE — Progress Notes (Signed)
close

## 2022-12-01 ENCOUNTER — Telehealth: Payer: Self-pay

## 2022-12-01 NOTE — Telephone Encounter (Signed)
Kylie from [facility] called stating the pt was referred to them for an MRI but the pt needs a Pre Cert PA with his insurance before they can do it.

## 2022-12-05 ENCOUNTER — Other Ambulatory Visit: Payer: Self-pay | Admitting: Family

## 2022-12-05 ENCOUNTER — Ambulatory Visit: Payer: No Typology Code available for payment source | Admitting: Family

## 2022-12-05 ENCOUNTER — Ambulatory Visit
Admission: RE | Admit: 2022-12-05 | Discharge: 2022-12-05 | Disposition: A | Discharge status: Home / Self Care | Payer: No Typology Code available for payment source | Source: Ambulatory Visit | Attending: Family | Admitting: Family

## 2022-12-05 ENCOUNTER — Other Ambulatory Visit: Payer: Self-pay

## 2022-12-05 DIAGNOSIS — K769 Liver disease, unspecified: Secondary | ICD-10-CM | POA: Insufficient documentation

## 2022-12-05 MED ORDER — GADOBUTROL 1 MMOL/ML IV SOLN: 8.0000 mL | Freq: Once | INTRAVENOUS | Status: DC | PRN

## 2022-12-05 MED ORDER — LISINOPRIL 5 MG PO TABS
5.0000 mg | ORAL_TABLET | Freq: Every day | ORAL | 2 refills | Status: DC
  Filled 2022-12-05: qty 90, 90d supply, fill #0
  Filled 2023-02-28: qty 90, 90d supply, fill #1
  Filled 2023-06-20: qty 90, 90d supply, fill #2

## 2022-12-06 ENCOUNTER — Ambulatory Visit: Payer: No Typology Code available for payment source | Admitting: Family

## 2022-12-13 ENCOUNTER — Telehealth: Payer: Self-pay | Admitting: Family

## 2022-12-13 ENCOUNTER — Other Ambulatory Visit: Payer: Self-pay | Admitting: Family

## 2022-12-13 DIAGNOSIS — K769 Liver disease, unspecified: Secondary | ICD-10-CM

## 2022-12-13 NOTE — Telephone Encounter (Signed)
error 

## 2022-12-14 NOTE — Telephone Encounter (Signed)
Pt its requesting meds for MRI on Sunday(volume) to be sent to [facility]. See mychart message below.

## 2022-12-15 ENCOUNTER — Other Ambulatory Visit: Payer: Self-pay | Admitting: Family

## 2022-12-15 ENCOUNTER — Telehealth: Payer: Self-pay

## 2022-12-15 ENCOUNTER — Other Ambulatory Visit: Payer: Self-pay

## 2022-12-15 DIAGNOSIS — F40240 Claustrophobia: Secondary | ICD-10-CM

## 2022-12-15 MED ORDER — ALPRAZOLAM 0.5 MG PO TABS
0.5000 mg | ORAL_TABLET | Freq: Once | ORAL | 0 refills | Status: AC
  Filled 2022-12-15: qty 2, 2d supply, fill #0

## 2022-12-15 NOTE — Telephone Encounter (Signed)
Chris Hernandez,   Can we see if pt can have OPEN mri in [place]?   Preferably done by year end.   Can you let me know?

## 2022-12-15 NOTE — Telephone Encounter (Signed)
Barbaraann Rondo called from MRI at [facility] to state patient has been unable to have his MRI twice now because of claustrophobia.  Barbaraann Rondo states patient has just cancelled his upcoming appointment for MRI because of claustrophobia.  Barbaraann Rondo states he would like to know if we should pursue or just read what they have now (an incomplete study).

## 2022-12-15 NOTE — Telephone Encounter (Signed)
Called and spoke with pt  He prefers open MRI  Discussed with rasheedah ( referral coordinator) whom called pt She will reach out to [facility] to schedule in new year per patient

## 2022-12-15 NOTE — Telephone Encounter (Signed)
Patient is in process of possibly  getting Open MRI done in [place] To help with his Claustrophobia. Will inform you when done

## 2022-12-15 NOTE — Progress Notes (Signed)
I looked up patient on [place] Controlled Substances Reporting System PMP AWARE and saw no activity that raised concern of inappropriate use.   

## 2022-12-17 ENCOUNTER — Ambulatory Visit: Admission: RE | Admit: 2022-12-17 | Payer: No Typology Code available for payment source | Source: Ambulatory Visit

## 2022-12-29 ENCOUNTER — Ambulatory Visit (INDEPENDENT_AMBULATORY_CARE_PROVIDER_SITE_OTHER): Payer: 59 | Admitting: Family

## 2022-12-29 ENCOUNTER — Encounter: Payer: Self-pay | Admitting: Family

## 2022-12-29 VITALS — BP 132/82 | HR 80 | Temp 97.8°F | Ht 71.0 in | Wt 196.2 lb

## 2022-12-29 DIAGNOSIS — I700 Atherosclerosis of aorta: Secondary | ICD-10-CM | POA: Diagnosis not present

## 2022-12-29 DIAGNOSIS — K769 Liver disease, unspecified: Secondary | ICD-10-CM | POA: Diagnosis not present

## 2022-12-29 DIAGNOSIS — N400 Enlarged prostate without lower urinary tract symptoms: Secondary | ICD-10-CM

## 2022-12-29 NOTE — Progress Notes (Signed)
Assessment & Plan:  Enlarged prostate Assessment & Plan: Lab Results  Component Value Date   PSA 0.41 04/19/2022   PSA 0.58 03/18/2021   PSA 0.51 08/20/2019  PSA value stable.  Fortunately no symptoms at this time.  Mildly enlarged prostate incidentally seen on CT renal 11/25/22.  Pending repeat PSA to ensure stable.  Will continue to monitor.     Orders: -     PSA; Future  Atherosclerosis of aorta [facility]) Assessment & Plan: Patient is on potent statin.  He will continue Crestor 20 mg daily.  Pending lipid panel.  Discussed LDL goal less than 100, optimally closer to 70 due to family h/o ascvd.   Orders: -     Lipid panel; Future  Liver lesion Assessment & Plan: MRI abdomen without contrast obtained 12/05/2022.  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  Patient will consider his options.  He may consider CT Ab with contrast versus MRI Ab with contrast due to claustrophobia.  He may consider follow-up with gastroenterology, Dr Vicente Males,  for further advice as to any imaging is warranted.  He will let me know what he decides.      Return precautions given.   Risks, benefits, and alternatives of the medications and treatment plan prescribed today were discussed, and patient expressed understanding.   Education regarding symptom management and diagnosis given to patient on AVS either electronically or printed.  No follow-ups on file.  Chris Paris, FNP  Subjective:    Patient ID: Chris Hernandez, male    DOB: 01-Jan-1971, 52 y.o.   MRN: [id number]  CC: Chris Hernandez is a 52 y.o. male who presents today for follow up.   HPI: Feels well today.  No new complaints.  Right flank pain has resolved.  Denies trouble urinating, decreased urinary flow, urinary frequency  He is compliant with increased dose of Crestor 40 mg daily    MRI abdomen without contrast obtained 12/05/2022.  Small  subscapular lesion in the lower pole left kidney stable compared to prior CT in 2016. previous demonstrated mild right hydronephrosis has resolved since prior exam.  3 liver lesions shown on prior CT most likely represent benign hemangiomas although cannot be definitively characterized on exam due to lack of IV contrast.  Presented to the emergency room 11/25/2022 for  right flank pain.  History of kidney stones  CT renal 11/25/22  obtained showing mild right hydronephrosis secondary to distal right ureter calculus.  3 indeterminate lesions in the liver.  Advised dedicated abdominal MRI.  Diverticulosis without signs of diverticulitis.  Aortic atherosclerosis Allergies: Patient has no known allergies. Current Outpatient Medications on File Prior to Visit  Medication Sig Dispense Refill   Cholecalciferol 1.25 MG (50000 UT) capsule Take 1 capsule by mouth every week for 8 weeks 8 capsule 0   lisinopril (ZESTRIL) 5 MG tablet Take 1 tablet (5 mg total) by mouth daily. 90 tablet 2   rosuvastatin (CRESTOR) 20 MG tablet Take 1 tablet (20 mg total) by mouth daily. 90 tablet 3   No current facility-administered medications on file prior to visit.    Review of Systems  Constitutional:  Negative for chills and fever.  Respiratory:  Negative for cough.   Cardiovascular:  Negative for chest pain and palpitations.  Gastrointestinal:  Negative for nausea and vomiting.  Genitourinary:  Negative for difficulty urinating, flank pain and frequency.      Objective:    BP 132/82  Pulse 80   Temp 97.8 F (36.6 C) (Oral)   Ht '5\' 11"'$  (1.803 m)   Wt 196 lb 3.2 oz (89 kg)   SpO2 98%   BMI 27.36 kg/m  BP Readings from Last 3 Encounters:  12/29/22 132/82  11/25/22 (!) 143/106  08/28/22 130/88   Wt Readings from Last 3 Encounters:  12/29/22 196 lb 3.2 oz (89 kg)  11/25/22 185 lb (83.9 kg)  08/28/22 191 lb 6.4 oz (86.8 kg)    Physical Exam Vitals reviewed.  Constitutional:      Appearance: He is  well-developed.  Cardiovascular:     Rate and Rhythm: Regular rhythm.     Heart sounds: Normal heart sounds.  Pulmonary:     Effort: Pulmonary effort is normal. No respiratory distress.     Breath sounds: Normal breath sounds. No wheezing, rhonchi or rales.  Genitourinary:    Prostate: Enlarged.     Comments: Prostate symmetrically enlarged.  No masses appreciated on digital exam Skin:    General: Skin is warm and dry.  Neurological:     Mental Status: He is alert.  Psychiatric:        Speech: Speech normal.        Behavior: Behavior normal.

## 2022-12-29 NOTE — Assessment & Plan Note (Signed)
MRI abdomen without contrast obtained 12/05/2022.  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  Patient will consider his options.  He may consider CT Ab with contrast versus MRI Ab with contrast due to claustrophobia.  He may consider follow-up with gastroenterology, Dr Vicente Males,  for further advice as to any imaging is warranted.  He will let me know what he decides.

## 2022-12-29 NOTE — Assessment & Plan Note (Signed)
Patient is on potent statin.  He will continue Crestor 20 mg daily.  Pending lipid panel.  Discussed LDL goal less than 100, optimally closer to 70 due to family h/o ascvd.

## 2022-12-29 NOTE — Assessment & Plan Note (Addendum)
Lab Results  Component Value Date   PSA 0.41 04/19/2022   PSA 0.58 03/18/2021   PSA 0.51 08/20/2019  PSA value stable.  Fortunately no symptoms at this time.  Mildly enlarged prostate incidentally seen on CT renal 11/25/22.  Pending repeat PSA to ensure stable.  Will continue to monitor.

## 2023-01-19 ENCOUNTER — Other Ambulatory Visit: Payer: 59

## 2023-02-02 ENCOUNTER — Other Ambulatory Visit: Payer: 59

## 2023-02-06 ENCOUNTER — Other Ambulatory Visit: Payer: Self-pay

## 2023-02-28 ENCOUNTER — Other Ambulatory Visit: Payer: Self-pay

## 2023-03-29 ENCOUNTER — Ambulatory Visit: Payer: No Typology Code available for payment source | Admitting: Dermatology

## 2023-04-24 ENCOUNTER — Encounter: Payer: No Typology Code available for payment source | Admitting: Family

## 2023-05-07 ENCOUNTER — Other Ambulatory Visit: Payer: Self-pay

## 2023-05-07 ENCOUNTER — Other Ambulatory Visit: Payer: Self-pay | Admitting: Family

## 2023-05-07 MED ORDER — ROSUVASTATIN CALCIUM 20 MG PO TABS
20.0000 mg | ORAL_TABLET | Freq: Every day | ORAL | 3 refills | Status: DC
  Filled 2023-05-07: qty 90, 90d supply, fill #0
  Filled 2023-08-06: qty 90, 90d supply, fill #1
  Filled 2023-11-05: qty 90, 90d supply, fill #2
  Filled 2024-01-30: qty 90, 90d supply, fill #3

## 2023-06-20 ENCOUNTER — Other Ambulatory Visit: Payer: Self-pay

## 2023-06-22 ENCOUNTER — Other Ambulatory Visit: Payer: Self-pay

## 2023-07-11 ENCOUNTER — Ambulatory Visit (INDEPENDENT_AMBULATORY_CARE_PROVIDER_SITE_OTHER): Payer: 59 | Admitting: Dermatology

## 2023-07-11 VITALS — BP 133/90 | HR 79

## 2023-07-11 DIAGNOSIS — W908XXA Exposure to other nonionizing radiation, initial encounter: Secondary | ICD-10-CM | POA: Diagnosis not present

## 2023-07-11 DIAGNOSIS — L570 Actinic keratosis: Secondary | ICD-10-CM

## 2023-07-11 DIAGNOSIS — Z86018 Personal history of other benign neoplasm: Secondary | ICD-10-CM

## 2023-07-11 DIAGNOSIS — Z1283 Encounter for screening for malignant neoplasm of skin: Secondary | ICD-10-CM | POA: Diagnosis not present

## 2023-07-11 DIAGNOSIS — L820 Inflamed seborrheic keratosis: Secondary | ICD-10-CM | POA: Diagnosis not present

## 2023-07-11 DIAGNOSIS — D1801 Hemangioma of skin and subcutaneous tissue: Secondary | ICD-10-CM | POA: Diagnosis not present

## 2023-07-11 DIAGNOSIS — D485 Neoplasm of uncertain behavior of skin: Secondary | ICD-10-CM

## 2023-07-11 DIAGNOSIS — L578 Other skin changes due to chronic exposure to nonionizing radiation: Secondary | ICD-10-CM

## 2023-07-11 DIAGNOSIS — D229 Melanocytic nevi, unspecified: Secondary | ICD-10-CM

## 2023-07-11 DIAGNOSIS — L814 Other melanin hyperpigmentation: Secondary | ICD-10-CM | POA: Diagnosis not present

## 2023-07-11 DIAGNOSIS — D225 Melanocytic nevi of trunk: Secondary | ICD-10-CM

## 2023-07-11 DIAGNOSIS — L821 Other seborrheic keratosis: Secondary | ICD-10-CM

## 2023-07-11 NOTE — Progress Notes (Signed)
Follow-Up Visit   Subjective  Chris Hernandez is a 52 y.o. male who presents for the following: Skin Cancer Screening and Full Body Skin Exam The patient presents for Total-Body Skin Exam (TBSE) for skin cancer screening and mole check. The patient has spots, moles and lesions to be evaluated, some may be new or changing and the patient may have concern these could be cancer.  The following portions of the chart were reviewed this encounter and updated as appropriate: medications, allergies, medical history  Review of Systems:  No other skin or systemic complaints except as noted in HPI or Assessment and Plan.  Objective  Well appearing patient in no apparent distress; mood and affect are within normal limits.  A full examination was performed including scalp, head, eyes, ears, nose, lips, neck, chest, axillae, abdomen, back, buttocks, bilateral upper extremities, bilateral lower extremities, hands, feet, fingers, toes, fingernails, and toenails. All findings within normal limits unless otherwise noted below.   Relevant physical exam findings are noted in the Assessment and Plan.  R lat nasal bridge x 1 Erythematous stuck-on, waxy papule or plaque  R zygoma x 1, L infraorbital x 1 (2) Erythematous thin papules/macules with gritty scale.   right mid back 5.0 cm lat to spine 0.4 cm re-pigmented brown macule at site of bx proven moderately dysplastic nevus     R mid to low back 8.0 cm lat to spine 0.6 cm irregular brown macule.   Assessment & Plan   SKIN CANCER SCREENING PERFORMED TODAY.  ACTINIC DAMAGE - Chronic condition, secondary to cumulative UV/sun exposure - diffuse scaly erythematous macules with underlying dyspigmentation - Recommend daily broad spectrum sunscreen SPF 30+ to sun-exposed areas, reapply every 2 hours as needed.  - Staying in the shade or wearing long sleeves, sun glasses (UVA+UVB protection) and wide brim hats (4-inch brim around the entire  circumference of the hat) are also recommended for sun protection.  - Call for new or changing lesions.  LENTIGINES, SEBORRHEIC KERATOSES, HEMANGIOMAS - Benign normal skin lesions - Benign-appearing - Call for any changes  MELANOCYTIC NEVI - Tan-brown and/or pink-flesh-colored symmetric macules and papules - Benign appearing on exam today - Observation - Call clinic for new or changing moles - Recommend daily use of broad spectrum spf 30+ sunscreen to sun-exposed areas.   HISTORY OF DYSPLASTIC NEVI No evidence of recurrence today Recommend regular full body skin exams Recommend daily broad spectrum sunscreen SPF 30+ to sun-exposed areas, reapply every 2 hours as needed.  Call if any new or changing lesions are noted between office visits  Inflamed seborrheic keratosis R lat nasal bridge x 1  Symptomatic, irritating, patient would like treated. RTC if not resolved in 8 weeks.   Destruction of lesion - R lat nasal bridge x 1 Complexity: simple   Destruction method: cryotherapy   Informed consent: discussed and consent obtained   Timeout:  patient name, date of birth, surgical site, and procedure verified Lesion destroyed using liquid nitrogen: Yes   Region frozen until ice ball extended beyond lesion: Yes   Outcome: patient tolerated procedure well with no complications   Post-procedure details: wound care instructions given    AK (actinic keratosis) (2) R zygoma x 1, L infraorbital x 1  Destruction of lesion - R zygoma x 1, L infraorbital x 1 (2) Complexity: simple   Destruction method: cryotherapy   Informed consent: discussed and consent obtained   Timeout:  patient name, date of birth, surgical site, and procedure  verified Lesion destroyed using liquid nitrogen: Yes   Region frozen until ice ball extended beyond lesion: Yes   Outcome: patient tolerated procedure well with no complications   Post-procedure details: wound care instructions given    Neoplasm of  uncertain behavior of skin (2) right mid back 5.0 cm lat to spine  Epidermal / dermal shaving  Lesion diameter (cm):  0.4 Informed consent: discussed and consent obtained   Timeout: patient name, date of birth, surgical site, and procedure verified   Procedure prep:  Patient was prepped and draped in usual sterile fashion Prep type:  Isopropyl alcohol Anesthesia: the lesion was anesthetized in a standard fashion   Anesthetic:  1% lidocaine w/ epinephrine 1-100,000 buffered w/ 8.4% NaHCO3 Instrument used: flexible razor blade   Hemostasis achieved with: pressure, aluminum chloride and electrodesiccation   Outcome: patient tolerated procedure well   Post-procedure details: sterile dressing applied and wound care instructions given   Dressing type: bandage and petrolatum    Specimen 1 - Surgical pathology Differential Diagnosis: D48.5 r/o recurrent dysplastic nevus Check Margins: Yes  R mid to low back 8.0 cm lat to spine  Epidermal / dermal shaving  Lesion diameter (cm):  0.6 Informed consent: discussed and consent obtained   Timeout: patient name, date of birth, surgical site, and procedure verified   Procedure prep:  Patient was prepped and draped in usual sterile fashion Prep type:  Isopropyl alcohol Anesthesia: the lesion was anesthetized in a standard fashion   Anesthetic:  1% lidocaine w/ epinephrine 1-100,000 buffered w/ 8.4% NaHCO3 Instrument used: flexible razor blade   Hemostasis achieved with: pressure, aluminum chloride and electrodesiccation   Outcome: patient tolerated procedure well   Post-procedure details: sterile dressing applied and wound care instructions given   Dressing type: bandage and petrolatum    Specimen 2 - Surgical pathology Differential Diagnosis: D48.5 r/o dysplastic nevus Check Margins: Yes   Return in about 1 year (around 07/10/2024) for TBSE.  Maylene Roes, CMA, am acting as scribe for Armida Sans, MD .  Documentation: I have  reviewed the above documentation for accuracy and completeness, and I agree with the above.  Armida Sans, MD

## 2023-07-11 NOTE — Patient Instructions (Addendum)
Wound Care Instructions  Cleanse wound gently with soap and water once a day then pat dry with clean gauze. Apply a thin coat of Petrolatum (petroleum jelly, "Vaseline") over the wound (unless you have an allergy to this). We recommend that you use a new, sterile tube of Vaseline. Do not pick or remove scabs. Do not remove the yellow or white "healing tissue" from the base of the wound.  Cover the wound with fresh, clean, nonstick gauze and secure with paper tape. You may use Band-Aids in place of gauze and tape if the wound is small enough, but would recommend trimming much of the tape off as there is often too much. Sometimes Band-Aids can irritate the skin.  You should call the office for your biopsy report after 1 week if you have not already been contacted.  If you experience any problems, such as abnormal amounts of bleeding, swelling, significant bruising, significant pain, or evidence of infection, please call the office immediately.  FOR ADULT SURGERY PATIENTS: If you need something for pain relief you may take 1 extra strength Tylenol (acetaminophen) AND 2 Ibuprofen (200mg each) together every 4 hours as needed for pain. (do not take these if you are allergic to them or if you have a reason you should not take them.) Typically, you may only need pain medication for 1 to 3 days.     Due to recent changes in healthcare laws, you may see results of your pathology and/or laboratory studies on MyChart before the doctors have had a chance to review them. We understand that in some cases there may be results that are confusing or concerning to you. Please understand that not all results are received at the same time and often the doctors may need to interpret multiple results in order to provide you with the best plan of care or course of treatment. Therefore, we ask that you please give us 2 business days to thoroughly review all your results before contacting the office for clarification. Should  we see a critical lab result, you will be contacted sooner.   If You Need Anything After Your Visit  If you have any questions or concerns for your doctor, please call our main line at [phone] and press option 4 to reach your doctor's medical assistant. If no one answers, please leave a voicemail as directed and we will return your call as soon as possible. Messages left after 4 pm will be answered the following business day.   You may also send us a message via MyChart. We typically respond to MyChart messages within 1-2 business days.  For prescription refills, please ask your pharmacy to contact our office. Our fax number is [phone].  If you have an urgent issue when the clinic is closed that cannot wait until the next business day, you can page your doctor at the number below.    Please note that while we do our best to be available for urgent issues outside of office hours, we are not available 24/7.   If you have an urgent issue and are unable to reach us, you may choose to seek medical care at your doctor's office, retail clinic, urgent care center, or emergency room.  If you have a medical emergency, please immediately call 911 or go to the emergency department.  Pager Numbers  - Dr. Kowalski: [phone]  - Dr. Moye: [phone]  - Dr. Stewart: [phone]  In the event of inclement weather, please call our main line at   [phone] for an update on the status of any delays or closures.  Dermatology Medication Tips: Please keep the boxes that topical medications come in in order to help keep track of the instructions about where and how to use these. Pharmacies typically print the medication instructions only on the boxes and not directly on the medication tubes.   If your medication is too expensive, please contact our office at [phone] option 4 or send us a message through MyChart.   We are unable to tell what your co-pay for medications will be in  advance as this is different depending on your insurance coverage. However, we may be able to find a substitute medication at lower cost or fill out paperwork to get insurance to cover a needed medication.   If a prior authorization is required to get your medication covered by your insurance company, please allow us 1-2 business days to complete this process.  Drug prices often vary depending on where the prescription is filled and some pharmacies may offer cheaper prices.  The website www.goodrx.com contains coupons for medications through different pharmacies. The prices here do not account for what the cost may be with help from insurance (it may be cheaper with your insurance), but the website can give you the price if you did not use any insurance.  - You can print the associated coupon and take it with your prescription to the pharmacy.  - You may also stop by our office during regular business hours and pick up a GoodRx coupon card.  - If you need your prescription sent electronically to a different pharmacy, notify our office through [place] MyChart or by phone at [phone] option 4.     Si Usted Necesita Algo Despus de Su Visita  Tambin puede enviarnos un mensaje a travs de MyChart. Por lo general respondemos a los mensajes de MyChart en el transcurso de 1 a 2 das hbiles.  Para renovar recetas, por favor pida a su farmacia que se ponga en contacto con nuestra oficina. Nuestro nmero de fax es el [phone].  Si tiene un asunto urgente cuando la clnica est cerrada y que no puede esperar hasta el siguiente da hbil, puede llamar/localizar a su doctor(a) al nmero que aparece a continuacin.   Por favor, tenga en cuenta que aunque hacemos todo lo posible para estar disponibles para asuntos urgentes fuera del horario de oficina, no estamos disponibles las 24 horas del da, los 7 das de la semana.   Si tiene un problema urgente y no puede comunicarse con nosotros, puede  optar por buscar atencin mdica  en el consultorio de su doctor(a), en una clnica privada, en un centro de atencin urgente o en una sala de emergencias.  Si tiene una emergencia mdica, por favor llame inmediatamente al 911 o vaya a la sala de emergencias.  Nmeros de bper  - Dr. Kowalski: [phone]  - Dra. Moye: [phone]  - Dra. Stewart: [phone]  En caso de inclemencias del tiempo, por favor llame a nuestra lnea principal al [phone] para una actualizacin sobre el estado de cualquier retraso o cierre.  Consejos para la medicacin en dermatologa: Por favor, guarde las cajas en las que vienen los medicamentos de uso tpico para ayudarle a seguir las instrucciones sobre dnde y cmo usarlos. Las farmacias generalmente imprimen las instrucciones del medicamento slo en las cajas y no directamente en los tubos del medicamento.   Si su medicamento es muy caro, por favor, pngase en contacto con   nuestra oficina llamando al [phone] y presione la opcin 4 o envenos un mensaje a travs de MyChart.   No podemos decirle cul ser su copago por los medicamentos por adelantado ya que esto es diferente dependiendo de la cobertura de su seguro. Sin embargo, es posible que podamos encontrar un medicamento sustituto a menor costo o llenar un formulario para que el seguro cubra el medicamento que se considera necesario.   Si se requiere una autorizacin previa para que su compaa de seguros cubra su medicamento, por favor permtanos de 1 a 2 das hbiles para completar este proceso.  Los precios de los medicamentos varan con frecuencia dependiendo del lugar de dnde se surte la receta y alguna farmacias pueden ofrecer precios ms baratos.  El sitio web www.goodrx.com tiene cupones para medicamentos de diferentes farmacias. Los precios aqu no tienen en cuenta lo que podra costar con la ayuda del seguro (puede ser ms barato con su seguro), pero el sitio web puede darle el  precio si no utiliz ningn seguro.  - Puede imprimir el cupn correspondiente y llevarlo con su receta a la farmacia.  - Tambin puede pasar por nuestra oficina durante el horario de atencin regular y recoger una tarjeta de cupones de GoodRx.  - Si necesita que su receta se enve electrnicamente a una farmacia diferente, informe a nuestra oficina a travs de MyChart de [place] o por telfono llamando al [phone] y presione la opcin 4.  

## 2023-07-15 ENCOUNTER — Encounter: Payer: Self-pay | Admitting: Dermatology

## 2023-07-25 ENCOUNTER — Telehealth: Payer: Self-pay

## 2023-07-25 NOTE — Telephone Encounter (Signed)
-----   Message from Armida Sans sent at 07/19/2023  6:06 PM EDT ----- Diagnosis 1. Skin , right mid back 5.0 cm lat to spine DYSPLASTIC COMPOUND NEVUS WITH MODERATE ATYPIA 2. Skin , right mid to low back 8.0 cm lat to spine DYSPLASTIC COMPOUND NEVUS WITH MODERATE ATYPIA  1& 2 = both moderate dysplastic Recheck next visit

## 2023-07-25 NOTE — Telephone Encounter (Signed)
Left voicemail to return my call

## 2023-08-02 ENCOUNTER — Telehealth: Payer: Self-pay

## 2023-08-02 NOTE — Telephone Encounter (Signed)
Left voicemail to return my call

## 2023-08-02 NOTE — Telephone Encounter (Signed)
-----   Message from Armida Sans sent at 07/19/2023  6:06 PM EDT ----- Diagnosis 1. Skin , right mid back 5.0 cm lat to spine DYSPLASTIC COMPOUND NEVUS WITH MODERATE ATYPIA 2. Skin , right mid to low back 8.0 cm lat to spine DYSPLASTIC COMPOUND NEVUS WITH MODERATE ATYPIA  1& 2 = both moderate dysplastic Recheck next visit

## 2023-08-07 ENCOUNTER — Telehealth: Payer: Self-pay

## 2023-08-07 NOTE — Telephone Encounter (Signed)
-----   Message from Armida Sans sent at 07/19/2023  6:06 PM EDT ----- Diagnosis 1. Skin , right mid back 5.0 cm lat to spine DYSPLASTIC COMPOUND NEVUS WITH MODERATE ATYPIA 2. Skin , right mid to low back 8.0 cm lat to spine DYSPLASTIC COMPOUND NEVUS WITH MODERATE ATYPIA  1& 2 = both moderate dysplastic Recheck next visit

## 2023-08-07 NOTE — Telephone Encounter (Signed)
Patient informed of pathology results 

## 2023-10-01 ENCOUNTER — Other Ambulatory Visit: Payer: Self-pay | Admitting: Family

## 2023-10-01 ENCOUNTER — Other Ambulatory Visit: Payer: Self-pay

## 2023-10-01 MED ORDER — LISINOPRIL 5 MG PO TABS
5.0000 mg | ORAL_TABLET | Freq: Every day | ORAL | 2 refills | Status: DC
  Filled 2023-10-01: qty 90, 90d supply, fill #0
  Filled 2024-01-01: qty 90, 90d supply, fill #1
  Filled 2024-03-28: qty 90, 90d supply, fill #2

## 2023-10-03 ENCOUNTER — Other Ambulatory Visit: Payer: Self-pay

## 2023-10-10 ENCOUNTER — Ambulatory Visit: Payer: 59 | Admitting: Family

## 2023-10-10 ENCOUNTER — Encounter: Payer: Self-pay | Admitting: Family

## 2023-10-10 VITALS — BP 128/86 | HR 86 | Temp 97.8°F | Ht 71.0 in | Wt 187.8 lb

## 2023-10-10 DIAGNOSIS — Z8639 Personal history of other endocrine, nutritional and metabolic disease: Secondary | ICD-10-CM | POA: Diagnosis not present

## 2023-10-10 DIAGNOSIS — I700 Atherosclerosis of aorta: Secondary | ICD-10-CM

## 2023-10-10 DIAGNOSIS — I10 Essential (primary) hypertension: Secondary | ICD-10-CM

## 2023-10-10 DIAGNOSIS — N400 Enlarged prostate without lower urinary tract symptoms: Secondary | ICD-10-CM

## 2023-10-10 DIAGNOSIS — K769 Liver disease, unspecified: Secondary | ICD-10-CM | POA: Diagnosis not present

## 2023-10-10 LAB — VITAMIN D 25 HYDROXY (VIT D DEFICIENCY, FRACTURES): VITD: 21.49 ng/mL — ABNORMAL LOW (ref 30.00–100.00)

## 2023-10-10 LAB — CBC WITH DIFFERENTIAL/PLATELET
Basophils Absolute: 0.1 10*3/uL (ref 0.0–0.1)
Basophils Relative: 0.8 % (ref 0.0–3.0)
Eosinophils Absolute: 0.2 10*3/uL (ref 0.0–0.7)
Eosinophils Relative: 2.3 % (ref 0.0–5.0)
HCT: 48 % (ref 39.0–52.0)
Hemoglobin: 16.2 g/dL (ref 13.0–17.0)
Lymphocytes Relative: 34.6 % (ref 12.0–46.0)
Lymphs Abs: 2.4 10*3/uL (ref 0.7–4.0)
MCHC: 33.8 g/dL (ref 30.0–36.0)
MCV: 85.5 fL (ref 78.0–100.0)
Monocytes Absolute: 0.6 10*3/uL (ref 0.1–1.0)
Monocytes Relative: 8.9 % (ref 3.0–12.0)
Neutro Abs: 3.8 10*3/uL (ref 1.4–7.7)
Neutrophils Relative %: 53.4 % (ref 43.0–77.0)
Platelets: 186 10*3/uL (ref 150.0–400.0)
RBC: 5.62 Mil/uL (ref 4.22–5.81)
RDW: 14.3 % (ref 11.5–15.5)
WBC: 7.1 10*3/uL (ref 4.0–10.5)

## 2023-10-10 LAB — COMPREHENSIVE METABOLIC PANEL
ALT: 19 U/L (ref 0–53)
AST: 18 U/L (ref 0–37)
Albumin: 4.9 g/dL (ref 3.5–5.2)
Alkaline Phosphatase: 45 U/L (ref 39–117)
BUN: 16 mg/dL (ref 6–23)
CO2: 27 meq/L (ref 19–32)
Calcium: 9.7 mg/dL (ref 8.4–10.5)
Chloride: 105 meq/L (ref 96–112)
Creatinine, Ser: 1.01 mg/dL (ref 0.40–1.50)
GFR: 85.08 mL/min (ref >60.00)
Glucose, Bld: 117 mg/dL — ABNORMAL HIGH (ref 70–99)
Potassium: 4.6 meq/L (ref 3.5–5.1)
Sodium: 140 meq/L (ref 135–145)
Total Bilirubin: 0.7 mg/dL (ref 0.2–1.2)
Total Protein: 7.2 g/dL (ref 6.0–8.3)

## 2023-10-10 LAB — LIPID PANEL
Cholesterol: 187 mg/dL (ref 0–200)
HDL: 41.5 mg/dL (ref >39.00)
LDL Cholesterol: 114 mg/dL — ABNORMAL HIGH (ref 0–99)
NonHDL: 145.39
Total CHOL/HDL Ratio: 5
Triglycerides: 156 mg/dL — ABNORMAL HIGH (ref 0.0–149.0)
VLDL: 31.2 mg/dL (ref 0.0–40.0)

## 2023-10-10 LAB — PSA: PSA: 0.57 ng/mL (ref 0.10–4.00)

## 2023-10-10 LAB — TSH: TSH: 2.57 u[IU]/mL (ref 0.35–5.50)

## 2023-10-10 NOTE — Assessment & Plan Note (Signed)
Chronic, symptomatically stable.  Pending lipid panel.  Continue Crestor 20 mg qd

## 2023-10-10 NOTE — Assessment & Plan Note (Signed)
Chronic, stable. continue lisinopril 5 mg

## 2023-10-10 NOTE — Progress Notes (Signed)
Assessment & Plan:  Primary hypertension Assessment & Plan: Chronic, stable .continue lisinopril 5mg .   Orders: -     CBC with Differential/Platelet -     Comprehensive metabolic panel -     TSH  Atherosclerosis of aorta (HCC) Assessment & Plan: Chronic, symptomatically stable.  Pending lipid panel.  Continue Crestor 20 mg qd  Orders: -     Lipid panel  Enlarged prostate -     PSA  History of vitamin D deficiency -     VITAMIN D 25 Hydroxy (Vit-D Deficiency, Fractures)  Liver lesion Assessment & Plan: We discussed again the need to pursue MRI with contrast to further characterize liver lesion and ensure it is a benign lesion versus malignancy.  Alternatively, I discussed arranging a consult with Dr. Tobi Bastos to discuss in detail.  Patient has claustrophobia.  He declines consult with Dr. Tobi Bastos at this time and will let me know when and if he is ready to pursue.        Return precautions given.   Risks, benefits, and alternatives of the medications and treatment plan prescribed today were discussed, and patient expressed understanding.   Education regarding symptom management and diagnosis given to patient on AVS either electronically or printed.  No follow-ups on file.  Rennie Plowman, FNP  Subjective:    Patient ID: Chris Hernandez, male    DOB: February 17, 1971, 52 y.o.   MRN: [id number]  CC: Chris Hernandez is a 52 y.o. male who presents today for follow up.   HPI: Feels well today.  No new complaints.  He was unable to repeat MRI abdomen due to cluster   denies urinary hesistancy, N, v, constipation, decreased urine stream.   Colonoscopy is up to date.      Allergies: Patient has no known allergies. Current Outpatient Medications on File Prior to Visit  Medication Sig Dispense Refill   lisinopril (ZESTRIL) 5 MG tablet Take 1 tablet (5 mg total) by mouth daily. 90 tablet 2   rosuvastatin (CRESTOR) 20 MG tablet Take 1 tablet (20 mg total) by mouth  daily. 90 tablet 3   No current facility-administered medications on file prior to visit.    Review of Systems  Constitutional:  Negative for chills and fever.  Respiratory:  Negative for cough.   Cardiovascular:  Negative for chest pain and palpitations.  Gastrointestinal:  Negative for nausea and vomiting.      Objective:    BP 128/86   Pulse 86   Temp 97.8 F (36.6 C) (Oral)   Ht 5\' 11"  (1.803 m)   Wt 187 lb 12.8 oz (85.2 kg)   SpO2 96%   BMI 26.19 kg/m  BP Readings from Last 3 Encounters:  10/10/23 128/86  07/11/23 (!) 133/90  12/29/22 132/82   Wt Readings from Last 3 Encounters:  10/10/23 187 lb 12.8 oz (85.2 kg)  12/29/22 196 lb 3.2 oz (89 kg)  11/25/22 185 lb (83.9 kg)    Physical Exam Vitals reviewed.  Constitutional:      Appearance: He is well-developed.  Cardiovascular:     Rate and Rhythm: Regular rhythm.     Heart sounds: Normal heart sounds.  Pulmonary:     Effort: Pulmonary effort is normal. No respiratory distress.     Breath sounds: Normal breath sounds. No wheezing, rhonchi or rales.  Skin:    General: Skin is warm and dry.  Neurological:     Mental Status: He is alert.  Psychiatric:  Speech: Speech normal.        Behavior: Behavior normal.

## 2023-10-10 NOTE — Patient Instructions (Signed)
Nice seeing you today.  As discussed, please let me know when you are ready, if you are ready, for consult with Dr. Tobi Bastos, gastroenterology.  I would like to ensure that we have fully evaluated liver lesion.

## 2023-10-10 NOTE — Assessment & Plan Note (Addendum)
We discussed again the need to pursue MRI with contrast to further characterize liver lesion and ensure it is a benign lesion versus malignancy.  Alternatively, I discussed arranging a consult with Dr. Tobi Bastos to discuss in detail.  Patient has claustrophobia.  He declines consult with Dr. Tobi Bastos at this time and will let me know when and if he is ready to pursue.

## 2023-10-19 DIAGNOSIS — H524 Presbyopia: Secondary | ICD-10-CM | POA: Diagnosis not present

## 2024-01-01 ENCOUNTER — Other Ambulatory Visit: Payer: Self-pay

## 2024-04-28 ENCOUNTER — Other Ambulatory Visit: Payer: Self-pay | Admitting: Family

## 2024-04-29 ENCOUNTER — Other Ambulatory Visit: Payer: Self-pay

## 2024-04-29 ENCOUNTER — Other Ambulatory Visit: Payer: Self-pay | Admitting: Family

## 2024-04-29 MED FILL — Rosuvastatin Calcium Tab 20 MG: ORAL | 90 days supply | Qty: 90 | Fill #0 | Status: AC

## 2024-06-17 ENCOUNTER — Other Ambulatory Visit: Payer: Self-pay | Admitting: Family

## 2024-06-17 ENCOUNTER — Other Ambulatory Visit: Payer: Self-pay

## 2024-06-17 MED ORDER — ROSUVASTATIN CALCIUM 20 MG PO TABS
20.0000 mg | ORAL_TABLET | Freq: Every day | ORAL | 0 refills | Status: DC
  Filled 2024-06-17 – 2024-08-07 (×2): qty 90, 90d supply, fill #0

## 2024-06-17 MED ORDER — LISINOPRIL 5 MG PO TABS
5.0000 mg | ORAL_TABLET | Freq: Every day | ORAL | 2 refills | Status: AC
  Filled 2024-06-17: qty 90, 90d supply, fill #0
  Filled 2024-10-07: qty 90, 90d supply, fill #1
  Filled 2024-12-15 – 2024-12-16 (×3): qty 90, 90d supply, fill #2

## 2024-06-26 ENCOUNTER — Ambulatory Visit: Payer: 59 | Admitting: Dermatology

## 2024-07-03 ENCOUNTER — Ambulatory Visit: Admitting: Dermatology

## 2024-07-25 ENCOUNTER — Ambulatory Visit (INDEPENDENT_AMBULATORY_CARE_PROVIDER_SITE_OTHER): Admitting: Family

## 2024-07-25 ENCOUNTER — Ambulatory Visit (INDEPENDENT_AMBULATORY_CARE_PROVIDER_SITE_OTHER)

## 2024-07-25 ENCOUNTER — Encounter: Payer: 59 | Admitting: Family

## 2024-07-25 ENCOUNTER — Other Ambulatory Visit: Payer: Self-pay

## 2024-07-25 ENCOUNTER — Other Ambulatory Visit

## 2024-07-25 ENCOUNTER — Encounter: Payer: Self-pay | Admitting: Family

## 2024-07-25 VITALS — BP 138/78 | HR 97 | Temp 98.1°F | Ht 71.0 in | Wt 195.0 lb

## 2024-07-25 DIAGNOSIS — I10 Essential (primary) hypertension: Secondary | ICD-10-CM

## 2024-07-25 DIAGNOSIS — R109 Unspecified abdominal pain: Secondary | ICD-10-CM

## 2024-07-25 DIAGNOSIS — N400 Enlarged prostate without lower urinary tract symptoms: Secondary | ICD-10-CM | POA: Diagnosis not present

## 2024-07-25 DIAGNOSIS — Z0000 Encounter for general adult medical examination without abnormal findings: Secondary | ICD-10-CM | POA: Diagnosis not present

## 2024-07-25 DIAGNOSIS — Z87442 Personal history of urinary calculi: Secondary | ICD-10-CM | POA: Diagnosis not present

## 2024-07-25 DIAGNOSIS — E785 Hyperlipidemia, unspecified: Secondary | ICD-10-CM

## 2024-07-25 LAB — URINALYSIS
Bilirubin Urine: NEGATIVE
Hgb urine dipstick: NEGATIVE
Ketones, ur: NEGATIVE
Leukocytes,Ua: NEGATIVE
Nitrite: NEGATIVE
Specific Gravity, Urine: 1.02 (ref 1.000–1.030)
Total Protein, Urine: NEGATIVE
Urine Glucose: NEGATIVE
Urobilinogen, UA: 0.2 (ref 0.0–1.0)
pH: 5.5 (ref 5.0–8.0)

## 2024-07-25 LAB — LIPID PANEL
Cholesterol: 175 mg/dL (ref 0–200)
HDL: 37.4 mg/dL — ABNORMAL LOW (ref >39.00)
LDL Cholesterol: 105 mg/dL — ABNORMAL HIGH (ref 0–99)
NonHDL: 137.6
Total CHOL/HDL Ratio: 5
Triglycerides: 161 mg/dL — ABNORMAL HIGH (ref 0.0–149.0)
VLDL: 32.2 mg/dL (ref 0.0–40.0)

## 2024-07-25 LAB — COMPREHENSIVE METABOLIC PANEL WITH GFR
ALT: 16 U/L (ref 0–53)
AST: 14 U/L (ref 0–37)
Albumin: 4.9 g/dL (ref 3.5–5.2)
Alkaline Phosphatase: 39 U/L (ref 39–117)
BUN: 10 mg/dL (ref 6–23)
CO2: 30 meq/L (ref 19–32)
Calcium: 9.3 mg/dL (ref 8.4–10.5)
Chloride: 103 meq/L (ref 96–112)
Creatinine, Ser: 0.92 mg/dL (ref 0.40–1.50)
GFR: 94.64 mL/min (ref >60.00)
Glucose, Bld: 101 mg/dL — ABNORMAL HIGH (ref 70–99)
Potassium: 4.4 meq/L (ref 3.5–5.1)
Sodium: 141 meq/L (ref 135–145)
Total Bilirubin: 0.6 mg/dL (ref 0.2–1.2)
Total Protein: 7.2 g/dL (ref 6.0–8.3)

## 2024-07-25 LAB — CBC WITH DIFFERENTIAL/PLATELET
Basophils Absolute: 0.1 K/uL (ref 0.0–0.1)
Basophils Relative: 1.1 % (ref 0.0–3.0)
Eosinophils Absolute: 0.1 K/uL (ref 0.0–0.7)
Eosinophils Relative: 2.2 % (ref 0.0–5.0)
HCT: 47.3 % (ref 39.0–52.0)
Hemoglobin: 16.1 g/dL (ref 13.0–17.0)
Lymphocytes Relative: 33 % (ref 12.0–46.0)
Lymphs Abs: 2.1 K/uL (ref 0.7–4.0)
MCHC: 34.1 g/dL (ref 30.0–36.0)
MCV: 84.1 fl (ref 78.0–100.0)
Monocytes Absolute: 0.6 K/uL (ref 0.1–1.0)
Monocytes Relative: 8.8 % (ref 3.0–12.0)
Neutro Abs: 3.4 K/uL (ref 1.4–7.7)
Neutrophils Relative %: 54.9 % (ref 43.0–77.0)
Platelets: 190 K/uL (ref 150.0–400.0)
RBC: 5.63 Mil/uL (ref 4.22–5.81)
RDW: 14.6 % (ref 11.5–15.5)
WBC: 6.2 K/uL (ref 4.0–10.5)

## 2024-07-25 LAB — PSA: PSA: 0.53 ng/mL (ref 0.10–4.00)

## 2024-07-25 LAB — TSH: TSH: 3.03 u[IU]/mL (ref 0.35–5.50)

## 2024-07-25 MED ORDER — MELOXICAM 7.5 MG PO TABS
7.5000 mg | ORAL_TABLET | Freq: Every day | ORAL | 1 refills | Status: AC | PRN
  Filled 2024-07-25: qty 30, 30d supply, fill #0

## 2024-07-25 NOTE — Patient Instructions (Addendum)
 Mobic  for 7-10 days for back pain.  Please let me know how you are doing and if you have any new symptoms.    A couple of points in regards to meloxicam  ( Mobic ) -  This medication is not intended for daily , long term use. It is a potent anti inflammatory ( NSAID), and my intention is for you take as needed for moderate to severe pain. If you find yourself using daily, please let me know.   Please takes Mobic  ( meloxicam ) with FOOD since it is an anti-inflammatory as it can cause a GI bleed or ulcer. If you have a history of GI bleed or ulcer, please do NOT take.  Do no take over the counter aleve, motrin, advil, goody's powder for pain as they are also NSAIDs, and they are  in the same class as Mobic   Lastly, we will need to monitor kidney function while on Mobic , and if we were to see any decline in kidney function in the future, we would have to discontinue this medication.   Health Maintenance, Male Adopting a healthy lifestyle and getting preventive care are important in promoting health and wellness. Ask your health care provider about: The right schedule for you to have regular tests and exams. Things you can do on your own to prevent diseases and keep yourself healthy. What should I know about diet, weight, and exercise? Eat a healthy diet  Eat a diet that includes plenty of vegetables, fruits, low-fat dairy products, and lean protein. Do not eat a lot of foods that are high in solid fats, added sugars, or sodium. Maintain a healthy weight Body mass index (BMI) is a measurement that can be used to identify possible weight problems. It estimates body fat based on height and weight. Your health care provider can help determine your BMI and help you achieve or maintain a healthy weight. Get regular exercise Get regular exercise. This is one of the most important things you can do for your health. Most adults should: Exercise for at least 150 minutes each week. The exercise should  increase your heart rate and make you sweat (moderate-intensity exercise). Do strengthening exercises at least twice a week. This is in addition to the moderate-intensity exercise. Spend less time sitting. Even light physical activity can be beneficial. Watch cholesterol and blood lipids Have your blood tested for lipids and cholesterol at 54 years of age, then have this test every 5 years. You may need to have your cholesterol levels checked more often if: Your lipid or cholesterol levels are high. You are older than 54 years of age. You are at high risk for heart disease. What should I know about cancer screening? Many types of cancers can be detected early and may often be prevented. Depending on your health history and family history, you may need to have cancer screening at various ages. This may include screening for: Colorectal cancer. Prostate cancer. Skin cancer. Lung cancer. What should I know about heart disease, diabetes, and high blood pressure? Blood pressure and heart disease High blood pressure causes heart disease and increases the risk of stroke. This is more likely to develop in people who have high blood pressure readings or are overweight. Talk with your health care provider about your target blood pressure readings. Have your blood pressure checked: Every 3-5 years if you are 59-61 years of age. Every year if you are 82 years old or older. If you are between the ages of 10 and 59  and are a current or former smoker, ask your health care provider if you should have a one-time screening for abdominal aortic aneurysm (AAA). Diabetes Have regular diabetes screenings. This checks your fasting blood sugar level. Have the screening done: Once every three years after age 85 if you are at a normal weight and have a low risk for diabetes. More often and at a younger age if you are overweight or have a high risk for diabetes. What should I know about preventing  infection? Hepatitis B If you have a higher risk for hepatitis B, you should be screened for this virus. Talk with your health care provider to find out if you are at risk for hepatitis B infection. Hepatitis C Blood testing is recommended for: Everyone born from [birth year] through 1965. Anyone with known risk factors for hepatitis C. Sexually transmitted infections (STIs) You should be screened each year for STIs, including gonorrhea and chlamydia, if: You are sexually active and are younger than 54 years of age. You are older than 54 years of age and your health care provider tells you that you are at risk for this type of infection. Your sexual activity has changed since you were last screened, and you are at increased risk for chlamydia or gonorrhea. Ask your health care provider if you are at risk. Ask your health care provider about whether you are at high risk for HIV. Your health care provider may recommend a prescription medicine to help prevent HIV infection. If you choose to take medicine to prevent HIV, you should first get tested for HIV. You should then be tested every 3 months for as long as you are taking the medicine. Follow these instructions at home: Alcohol use Do not drink alcohol if your health care provider tells you not to drink. If you drink alcohol: Limit how much you have to 0-2 drinks a day. Know how much alcohol is in your drink. In the [place] one drink equals one 12 oz bottle of beer (355 mL), one 5 oz glass of wine (148 mL), or one 1 oz glass of hard liquor (44 mL). Lifestyle Do not use any products that contain nicotine or tobacco. These products include cigarettes, chewing tobacco, and vaping devices, such as e-cigarettes. If you need help quitting, ask your health care provider. Do not use street drugs. Do not share needles. Ask your health care provider for help if you need support or information about quitting drugs. General instructions Schedule regular health,  dental, and eye exams. Stay current with your vaccines. Tell your health care provider if: You often feel depressed. You have ever been abused or do not feel safe at home. Summary Adopting a healthy lifestyle and getting preventive care are important in promoting health and wellness. Follow your health care provider's instructions about healthy diet, exercising, and getting tested or screened for diseases. Follow your health care provider's instructions on monitoring your cholesterol and blood pressure. This information is not intended to replace advice given to you by your health care provider. Make sure you discuss any questions you have with your health care provider. Document Revised: 04/25/2021 Document Reviewed: 04/25/2021 Elsevier Patient Education  2024 [employer].

## 2024-07-25 NOTE — Progress Notes (Signed)
 Assessment & Plan:  Annual physical exam Assessment & Plan: Colonoscopy up-to-date.  Politely declines pneumonia vaccine   Right flank pain Assessment & Plan: No systemic features.  Pain not reproducible on my exam.  Pain does seem positional and we discussed musculoskeletal etiology.  History of renal stone.  Pending abdominal x-ray, labs.  Discussed trial of meloxicam  7.5mg  for 7 to 10 days.  Orders: -     DG Thoracic Spine W/Swimmers; Future -     DG Abd 1 View; Future -     Meloxicam ; Take 1 tablet (7.5 mg total) by mouth daily as needed for pain.  Dispense: 30 tablet; Refill: 1 -     Urinalysis -     Urine Culture; Future  Primary hypertension -     CBC with Differential/Platelet -     Comprehensive metabolic panel with GFR -     TSH  Hyperlipidemia, unspecified hyperlipidemia type -     Lipid panel  Enlarged prostate -     PSA     Return precautions given.   Risks, benefits, and alternatives of the medications and treatment plan prescribed today were discussed, and patient expressed understanding.   Education regarding symptom management and diagnosis given to patient on AVS either electronically or printed.  No follow-ups on file.  Rollene Northern, FNP  Subjective:    Patient ID: Chris Hernandez, male    DOB: 05/07/1971, 53 y.o.   MRN: [id number]  CC: Chris Hernandez is a 53 y.o. male who presents today for physical exam.    HPI: Complains of right mid back pain , episodic, x 3 weeks ago Pain is worse when he gets up in the morning ; improves when gets out of bed, stands up, and pain will improve the next couple of hours.  Reports an injury in highschool when lifting weights.  No recent injury.  He is worried weight gain may be contributory.  Appetite is normal.  Denies fever, chills, rash, cp, sob, constipation, diarrhea, nausea, hematuria, urinary frequency, numbness, groin pain, urinary or fecal incontinence.   He has seen occassional BRB  blood on toilet paper episodic.  He has a history of hemorrhoids.  Denies rectal itching. He can feel protrusion from hemorrhoid.  No change stool, coffee ground . stool is not maroon in color.   H/o renal stone    Ct renal 11/25/22  Mild right hydronephrosis and hydroureter secondary to 2 mm distal right ureteral calculus. Follows with dermatology  Colorectal  Cancer Screening: UTD , Dr Therisa repeat in 5 years; hemorrhoids on report.  Prostate Cancer Screening: No urinary hesitancy.   Lung Cancer Screening: No 30 year pack year history and > 50 years to 80 years.   Immunizations       Tetanus - UTD        Pneumococcal - Candidate for. declines  Exercise: Gets regular exercise.   Alcohol use:  occassional Smoking/tobacco use: Nonsmoker.    Health Maintenance  Topic Date Due   Hepatitis B Vaccine (1 of 3 - 19+ 3-dose series) Never done   Zoster (Shingles) Vaccine (1 of 2) Never done   COVID-19 Vaccine (4 - 2024-25 season) 08/10/2024*   Flu Shot  03/17/2025*   Pneumococcal Vaccine for age over 68 (1 of 1 - PCV) 07/25/2025*   Colon Cancer Screening  06/01/2026   DTaP/Tdap/Td vaccine (3 - Td or Tdap) 03/19/2031   Hepatitis C Screening  Completed   HIV Screening  Completed  HPV Vaccine  Aged Out   Meningitis B Vaccine  Aged Out  *Topic was postponed. The date shown is not the original due date.     ALLERGIES: Patient has no known allergies.  Current Outpatient Medications on File Prior to Visit  Medication Sig Dispense Refill   lisinopril  (ZESTRIL ) 5 MG tablet Take 1 tablet (5 mg total) by mouth daily. 90 tablet 2   rosuvastatin  (CRESTOR ) 20 MG tablet Take 1 tablet (20 mg total) by mouth daily. 90 tablet 0   No current facility-administered medications on file prior to visit.    Review of Systems  Constitutional:  Negative for chills and fever.  HENT:  Negative for congestion.   Respiratory:  Negative for cough.   Cardiovascular:  Negative for chest pain,  palpitations and leg swelling.  Gastrointestinal:  Positive for anal bleeding. Negative for diarrhea, nausea, rectal pain and vomiting.  Genitourinary:  Negative for difficulty urinating, frequency and hematuria.  Musculoskeletal:  Positive for neck pain. Negative for myalgias.  Skin:  Negative for rash.  Neurological:  Negative for headaches.  Hematological:  Negative for adenopathy.  Psychiatric/Behavioral:  Negative for confusion.       Objective:    BP 138/78 (BP Location: Left Arm, Patient Position: Sitting, Cuff Size: Normal)   Pulse 97   Temp 98.1 F (36.7 C) (Oral)   Ht 5' 11 (1.803 m)   Wt 195 lb (88.5 kg)   SpO2 97%   BMI 27.20 kg/m   BP Readings from Last 3 Encounters:  07/25/24 138/78  10/10/23 128/86  07/11/23 (!) 133/90   Wt Readings from Last 3 Encounters:  07/25/24 195 lb (88.5 kg)  10/10/23 187 lb 12.8 oz (85.2 kg)  12/29/22 196 lb 3.2 oz (89 kg)    Physical Exam Vitals reviewed.  Constitutional:      Appearance: Normal appearance. He is well-developed.  Cardiovascular:     Rate and Rhythm: Regular rhythm.     Heart sounds: Normal heart sounds.  Pulmonary:     Effort: Pulmonary effort is normal. No respiratory distress.     Breath sounds: Normal breath sounds. No wheezing, rhonchi or rales.  Abdominal:     General: Bowel sounds are normal. There is no distension.     Palpations: Abdomen is soft. Abdomen is not rigid. There is no fluid wave or mass.     Tenderness: There is no abdominal tenderness. There is no guarding or rebound. Negative signs include Murphy's sign and McBurney's sign.     Comments: No suprapubic tenderness.   Musculoskeletal:       Arms:     Comments: No tenderness over right flank.  Area marked on diagram  where patient describes pain.  Unable to reproduce pain with deep inspiration lateral side bends, palpation. No rash.   Skin:    General: Skin is warm and dry.  Neurological:     Mental Status: He is alert.  Psychiatric:         Speech: Speech normal.        Behavior: Behavior normal.

## 2024-07-26 LAB — URINE CULTURE
MICRO NUMBER:: 16806396
Result:: NO GROWTH
SPECIMEN QUALITY:: ADEQUATE

## 2024-07-27 ENCOUNTER — Ambulatory Visit: Payer: Self-pay | Admitting: Family

## 2024-07-28 NOTE — Assessment & Plan Note (Signed)
 No systemic features.  Pain not reproducible on my exam.  Pain does seem positional and we discussed musculoskeletal etiology.  History of renal stone.  Pending abdominal x-ray, labs.  Discussed trial of meloxicam  7.5mg  for 7 to 10 days.

## 2024-07-28 NOTE — Assessment & Plan Note (Signed)
 Colonoscopy up-to-date.  Politely declines pneumonia vaccine

## 2024-07-31 ENCOUNTER — Ambulatory Visit: Payer: Self-pay | Admitting: Family

## 2024-08-07 ENCOUNTER — Other Ambulatory Visit: Payer: Self-pay

## 2024-11-18 ENCOUNTER — Other Ambulatory Visit: Payer: Self-pay | Admitting: Family

## 2024-11-18 ENCOUNTER — Other Ambulatory Visit: Payer: Self-pay

## 2024-11-18 MED FILL — Rosuvastatin Calcium Tab 20 MG: ORAL | 90 days supply | Qty: 90 | Fill #0 | Status: AC

## 2024-11-21 ENCOUNTER — Other Ambulatory Visit: Payer: Self-pay

## 2024-12-15 ENCOUNTER — Other Ambulatory Visit: Payer: Self-pay

## 2024-12-16 ENCOUNTER — Other Ambulatory Visit: Payer: Self-pay
